# Patient Record
Sex: Male | Born: 1990 | Race: White | Hispanic: No | Marital: Single | Smoking: Former smoker
Health system: Southern US, Community
[De-identification: ages and names within clinical notes are randomized; demographics above are authoritative.]

## PROBLEM LIST (undated history)

## (undated) HISTORY — PX: APPENDECTOMY: SHX54

---
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
## 2009-07-13 ENCOUNTER — Emergency Department (HOSPITAL_COMMUNITY): Admission: EM | Admit: 2009-07-13 | Discharge: 2009-07-13 | Payer: Self-pay | Admitting: Emergency Medicine

## 2010-02-25 ENCOUNTER — Emergency Department (HOSPITAL_COMMUNITY): Admission: EM | Admit: 2010-02-25 | Discharge: 2009-05-05 | Payer: Self-pay | Admitting: Emergency Medicine

## 2010-06-09 LAB — POCT I-STAT, CHEM 8
Chloride: 106 mEq/L (ref 96–112)
HCT: 41 % (ref 39.0–52.0)
TCO2: 27 mmol/L (ref 0–100)

## 2010-06-09 LAB — URINALYSIS, ROUTINE W REFLEX MICROSCOPIC
Bilirubin Urine: NEGATIVE
Glucose, UA: NEGATIVE mg/dL
Hgb urine dipstick: NEGATIVE
Nitrite: NEGATIVE
Protein, ur: NEGATIVE mg/dL
Specific Gravity, Urine: 1.029 (ref 1.005–1.030)
pH: 6.5 (ref 5.0–8.0)

## 2010-06-09 LAB — CBC
Platelets: 244 10*3/uL (ref 150–400)
RBC: 4.45 MIL/uL (ref 4.22–5.81)
RDW: 12.8 % (ref 11.5–15.5)

## 2010-06-09 LAB — DIFFERENTIAL
Lymphocytes Relative: 35 % (ref 12–46)
Lymphs Abs: 2.7 10*3/uL (ref 0.7–4.0)
Neutro Abs: 4.4 10*3/uL (ref 1.7–7.7)

## 2010-10-28 ENCOUNTER — Emergency Department (HOSPITAL_COMMUNITY)
Admission: EM | Admit: 2010-10-28 | Discharge: 2010-10-29 | Disposition: A | Discharge status: Home / Self Care | Payer: Self-pay | Attending: Emergency Medicine | Admitting: Emergency Medicine

## 2010-10-28 DIAGNOSIS — X58XXXA Exposure to other specified factors, initial encounter: Secondary | ICD-10-CM | POA: Insufficient documentation

## 2010-10-28 DIAGNOSIS — M545 Low back pain, unspecified: Secondary | ICD-10-CM | POA: Insufficient documentation

## 2010-10-28 DIAGNOSIS — Y9355 Activity, bike riding: Secondary | ICD-10-CM | POA: Insufficient documentation

## 2011-03-11 ENCOUNTER — Encounter: Payer: Self-pay | Admitting: *Deleted

## 2011-03-11 ENCOUNTER — Emergency Department (HOSPITAL_COMMUNITY): Payer: Self-pay

## 2011-03-11 ENCOUNTER — Emergency Department (HOSPITAL_COMMUNITY)
Admission: EM | Admit: 2011-03-11 | Discharge: 2011-03-11 | Disposition: A | Discharge status: Home / Self Care | Payer: Self-pay | Attending: Emergency Medicine | Admitting: Emergency Medicine

## 2011-03-11 DIAGNOSIS — R079 Chest pain, unspecified: Secondary | ICD-10-CM | POA: Insufficient documentation

## 2011-03-11 DIAGNOSIS — R0781 Pleurodynia: Secondary | ICD-10-CM

## 2011-03-11 DIAGNOSIS — R071 Chest pain on breathing: Secondary | ICD-10-CM | POA: Insufficient documentation

## 2011-03-11 LAB — POCT I-STAT, CHEM 8
BUN: 15 mg/dL (ref 6–23)
Calcium, Ion: 1.21 mmol/L (ref 1.12–1.32)
Chloride: 103 mEq/L (ref 96–112)
Creatinine, Ser: 0.9 mg/dL (ref 0.50–1.35)
Glucose, Bld: 90 mg/dL (ref 70–99)
HCT: 43 % (ref 39.0–52.0)
Hemoglobin: 14.6 g/dL (ref 13.0–17.0)
Potassium: 3.8 mEq/L (ref 3.5–5.1)
Sodium: 140 mEq/L (ref 135–145)
TCO2: 26 mmol/L (ref 0–100)

## 2011-03-11 MED ORDER — HYDROCODONE-ACETAMINOPHEN 5-325 MG PO TABS: 2.0000 | ORAL_TABLET | ORAL | Status: AC | PRN

## 2011-03-11 MED ORDER — IBUPROFEN 800 MG PO TABS
800.0000 mg | ORAL_TABLET | Freq: Once | ORAL | Status: AC
  Administered 2011-03-11: 800 mg via ORAL
  Filled 2011-03-11: qty 1

## 2011-03-11 NOTE — ED Provider Notes (Signed)
Medical screening examination/treatment/procedure(s) were performed by non-physician practitioner and as supervising physician I was immediately available for consultation/collaboration.  Isador Castille, MD 03/11/11 2359 

## 2011-03-11 NOTE — ED Provider Notes (Signed)
History   Pt c/o pain to R lower ribs for the past several days.  He sts pain is sharp, constant, worsening with movement and taking deep breath.  He did admits to "rough housing" with a friend the night before but does not recall any specific injury and pain after the incidence.  He did admits to vomiting once today due to pain but denies pain with eating.  He denies abd pain, back pain, or dysurea.  Denies recent surgery, calf pain or swelling, or going on long trip.  Pt has tried OTC NSAIDs with minimal relief.    CSN: [id number]  Arrival date & time 03/11/11  1722   First MD Initiated Contact with Patient 03/11/11 1926      Chief Complaint  Patient presents with  . Chest Pain    RT sided rib pain.    (Consider location/radiation/quality/duration/timing/severity/associated sxs/prior treatment) Patient is a 20 y.o. male presenting with chest pain. The history is provided by the patient. No language interpreter was used.  Chest Pain The chest pain began 3 - 5 days ago. Chest pain occurs constantly. The pain is associated with breathing. At its most intense, the pain is at 5/10. The pain is currently at 3/10. The severity of the pain is moderate. The quality of the pain is described as sharp. Radiates to: R chest,  R ribs. Exacerbated by: movement, deep inspiration.     History reviewed. No pertinent past medical history.  History reviewed. No pertinent past surgical history.  History reviewed. No pertinent family history.  History  Substance Use Topics  . Smoking status: Current Everyday Smoker  . Smokeless tobacco: Not on file  . Alcohol Use: Yes      Review of Systems  Cardiovascular: Positive for chest pain.    Allergies  Amoxicillin and Penicillins  Home Medications   Current Outpatient Rx  Name Route Sig Dispense Refill  . IBUPROFEN 200 MG PO TABS Oral Take 400 mg by mouth every 6 (six) hours as needed. pain       BP 124/64  Pulse 63  Temp 98 F (36.7 C)   Resp 20  SpO2 100%  Physical Exam  Nursing note and vitals reviewed. Constitutional:       Awake, alert, nontoxic appearance  HENT:  Head: Atraumatic.  Eyes: Right eye exhibits no discharge. Left eye exhibits no discharge.  Neck: Neck supple.  Cardiovascular: Normal rate and regular rhythm.  Exam reveals no gallop and no friction rub.   No murmur heard. Pulmonary/Chest: Effort normal. No respiratory distress. He has no wheezes. He has no rales. He exhibits tenderness.    Abdominal: Soft. Normal appearance. There is no tenderness. There is no rebound.  Musculoskeletal: He exhibits no tenderness.       Baseline ROM, no obvious new focal weakness  Neurological:       Mental status and motor strength appears baseline for patient and situation  Skin: No rash noted.  Psychiatric: He has a normal mood and affect.    ED Course  Procedures (including critical care time)  Labs Reviewed - No data to display Dg Ribs Unilateral W/chest Right  03/11/2011  *RADIOLOGY REPORT*  Clinical Data: Injury, mid to lower anterior right rib pain for 3 days  RIGHT RIBS AND CHEST - 3+ VIEW  Comparison: None  Findings: Normal heart size, mediastinal contours, and pulmonary vascularity. Lungs clear. No pleural effusion or pneumothorax. Osseous mineralization normal. Right ribs normal appearance.  IMPRESSION: Normal exam.  Original Report Authenticated By: Lollie Marrow, M.D.     No diagnosis found.    MDM  Pt appears to have musculoskeletal pain.  CXR shows no evidence of rib fractures or other acute findings.  Abdomen is benign.  No murphy sign.  Pt is PERC negative.  However, he is  overly concerns.  Will obtain a D-dimer and Istats and given ibuprofen.  His VS is stable.          Fayrene Helper, [place] 03/11/11 2133

## 2011-03-11 NOTE — ED Notes (Signed)
Pt in c/o right rib pain after injury x3 days, pain worse with movement and deep inspiration

## 2011-03-11 NOTE — ED Notes (Signed)
Pt states on Tuesday he and a friend were horsing around.  C/o RT sided rib pain worsening w/deep breath, cough, having a BM or movement.  No distress or shob while being assessed.

## 2011-09-20 ENCOUNTER — Encounter (HOSPITAL_COMMUNITY): Payer: Self-pay | Admitting: *Deleted

## 2011-09-20 ENCOUNTER — Emergency Department (HOSPITAL_COMMUNITY): Payer: Self-pay

## 2011-09-20 ENCOUNTER — Emergency Department (HOSPITAL_COMMUNITY)
Admission: EM | Admit: 2011-09-20 | Discharge: 2011-09-20 | Disposition: A | Discharge status: Home / Self Care | Payer: Self-pay | Attending: Emergency Medicine | Admitting: Emergency Medicine

## 2011-09-20 DIAGNOSIS — F17200 Nicotine dependence, unspecified, uncomplicated: Secondary | ICD-10-CM | POA: Insufficient documentation

## 2011-09-20 DIAGNOSIS — J3489 Other specified disorders of nose and nasal sinuses: Secondary | ICD-10-CM | POA: Insufficient documentation

## 2011-09-20 DIAGNOSIS — J40 Bronchitis, not specified as acute or chronic: Secondary | ICD-10-CM | POA: Insufficient documentation

## 2011-09-20 DIAGNOSIS — R059 Cough, unspecified: Secondary | ICD-10-CM | POA: Insufficient documentation

## 2011-09-20 DIAGNOSIS — R05 Cough: Secondary | ICD-10-CM | POA: Insufficient documentation

## 2011-09-20 MED ORDER — GUAIFENESIN ER 1200 MG PO TB12: 1.0000 | ORAL_TABLET | Freq: Two times a day (BID) | ORAL | Status: DC

## 2011-09-20 MED ORDER — ALBUTEROL SULFATE (5 MG/ML) 0.5% IN NEBU: 5.0000 mg | INHALATION_SOLUTION | Freq: Once | RESPIRATORY_TRACT | Status: DC

## 2011-09-20 MED ORDER — PREDNISONE 50 MG PO TABS: 50.0000 mg | ORAL_TABLET | Freq: Every day | ORAL | Status: AC

## 2011-09-20 MED ORDER — ALBUTEROL SULFATE HFA 108 (90 BASE) MCG/ACT IN AERS
2.0000 | INHALATION_SPRAY | RESPIRATORY_TRACT | Status: DC | PRN
  Administered 2011-09-20: 2 via RESPIRATORY_TRACT
  Filled 2011-09-20: qty 6.7

## 2011-09-20 MED ORDER — PREDNISONE 20 MG PO TABS
60.0000 mg | ORAL_TABLET | Freq: Once | ORAL | Status: AC
  Administered 2011-09-20: 60 mg via ORAL
  Filled 2011-09-20: qty 3

## 2011-09-20 MED ORDER — PROMETHAZINE-DM 6.25-15 MG/5ML PO SYRP: 5.0000 mL | ORAL_SOLUTION | Freq: Four times a day (QID) | ORAL | Status: AC | PRN

## 2011-09-20 NOTE — ED Provider Notes (Signed)
Medical screening examination/treatment/procedure(s) were performed by non-physician practitioner and as supervising physician I was immediately available for consultation/collaboration.  Cheri Guppy, MD 09/20/11 [phone]

## 2011-09-20 NOTE — ED Provider Notes (Signed)
History     CSN: [id number]  Arrival date & time 09/20/11  1315   First MD Initiated Contact with Patient 09/20/11 1411      Chief Complaint  Patient presents with  . Cough  . Nasal Congestion    (Consider location/radiation/quality/duration/timing/severity/associated sxs/prior treatment) HPI Patient presents emergency Dept. with 2 days of cough and chest congestion.  He states he stopped smoking 3 days ago.  Patient states that he does not have any fever, nausea, vomiting, abdominal pain, chest pain, shortness of breath, or coughing up blood.  Patient states he he did not attempt any  Treatment prior to arrival for symptoms.  He states the cough is worse at night.  History reviewed. No pertinent past medical history.  Past Surgical History  Procedure Date  . Appendectomy     No family history on file.  History  Substance Use Topics  . Smoking status: Current Everyday Smoker  . Smokeless tobacco: Not on file  . Alcohol Use: Yes      Review of Systems All other systems negative except as documented in the HPI. All pertinent positives and negatives as reviewed in the HPI.  Allergies  Amoxicillin and Penicillins  Home Medications  No current outpatient prescriptions on file.  BP 110/65  Pulse 55  Temp 98.2 F (36.8 C) (Oral)  Resp 14  SpO2 97%  Physical Exam  Nursing note and vitals reviewed. Constitutional: He is oriented to person, place, and time. He appears well-developed and well-nourished. No distress.  HENT:  Head: Normocephalic.  Mouth/Throat: Oropharynx is clear and moist.  Eyes: Pupils are equal, round, and reactive to light.  Neck: Normal range of motion. Neck supple.  Cardiovascular: Normal rate and regular rhythm.   Pulmonary/Chest: Effort normal. No respiratory distress. He has wheezes in the right lower field and the left lower field. He has no rales.  Neurological: He is alert and oriented to person, place, and time.  Skin: Skin is warm and  dry. No rash noted.    ED Course  Procedures (including critical care time)  Labs Reviewed - No data to display Dg Chest 2 View  09/20/2011  *RADIOLOGY REPORT*  Clinical Data: Smoker with 3-day history of productive cough.  CHEST - 2 VIEW  Comparison: One-view chest x-ray 03/11/2011.  Findings: Cardiomediastinal silhouette unremarkable, unchanged. Moderate central peribronchial thickening, more so than on the prior examination.  Lungs otherwise clear.  No pleural effusions. Visualized bony thorax intact.  IMPRESSION: Mild to moderate changes of acute bronchitis and/or asthma without localized airspace pneumonia.  Original Report Authenticated By: Arnell Sieving, M.D.    Patient treated for bronchitis based on his physical exam and history of present illness, along with his x-ray findings.  Patient is, advised to return here for any worsening in his condition told to increase his fluid intake.  All questions were answered and patient given the plan and voices an understanding.    MDM          Carlyle Dolly, PA-C 09/20/11 1514

## 2011-09-20 NOTE — ED Notes (Signed)
Pt states he has had cough and chest congestion for 2 days. Pt states he stop smoking 3 days ago and now he is coughing up green mucus. Pt denies any other c/o

## 2012-03-04 ENCOUNTER — Emergency Department (HOSPITAL_COMMUNITY)
Admission: EM | Admit: 2012-03-04 | Discharge: 2012-03-04 | Disposition: A | Discharge status: Home / Self Care | Payer: Self-pay | Attending: Emergency Medicine | Admitting: Emergency Medicine

## 2012-03-04 ENCOUNTER — Encounter (HOSPITAL_COMMUNITY): Payer: Self-pay | Admitting: *Deleted

## 2012-03-04 DIAGNOSIS — R12 Heartburn: Secondary | ICD-10-CM | POA: Insufficient documentation

## 2012-03-04 DIAGNOSIS — K2970 Gastritis, unspecified, without bleeding: Secondary | ICD-10-CM | POA: Insufficient documentation

## 2012-03-04 DIAGNOSIS — F17200 Nicotine dependence, unspecified, uncomplicated: Secondary | ICD-10-CM | POA: Insufficient documentation

## 2012-03-04 DIAGNOSIS — K2990 Gastroduodenitis, unspecified, without bleeding: Secondary | ICD-10-CM | POA: Insufficient documentation

## 2012-03-04 LAB — CBC WITH DIFFERENTIAL/PLATELET
Basophils Absolute: 0 10*3/uL (ref 0.0–0.1)
Basophils Relative: 0 % (ref 0–1)
HCT: 43 % (ref 39.0–52.0)
Lymphocytes Relative: 5 % — ABNORMAL LOW (ref 12–46)
Lymphs Abs: 0.7 10*3/uL (ref 0.7–4.0)
Monocytes Absolute: 0.8 10*3/uL (ref 0.1–1.0)
Neutro Abs: 11.1 10*3/uL — ABNORMAL HIGH (ref 1.7–7.7)
Neutrophils Relative %: 87 % — ABNORMAL HIGH (ref 43–77)
RDW: 12.4 % (ref 11.5–15.5)

## 2012-03-04 LAB — BLOOD GAS, VENOUS
Bicarbonate: 24.6 mEq/L — ABNORMAL HIGH (ref 20.0–24.0)
O2 Saturation: 73.9 %
pH, Ven: 7.455 — ABNORMAL HIGH (ref 7.250–7.300)
pO2, Ven: 36.2 mmHg (ref 30.0–45.0)

## 2012-03-04 LAB — COMPREHENSIVE METABOLIC PANEL
ALT: 64 U/L — ABNORMAL HIGH (ref 0–53)
AST: 29 U/L (ref 0–37)
Albumin: 4.7 g/dL (ref 3.5–5.2)
BUN: 22 mg/dL (ref 6–23)
Calcium: 9.5 mg/dL (ref 8.4–10.5)
Glucose, Bld: 102 mg/dL — ABNORMAL HIGH (ref 70–99)
Potassium: 3.5 mEq/L (ref 3.5–5.1)
Total Protein: 7.4 g/dL (ref 6.0–8.3)

## 2012-03-04 LAB — GLUCOSE, CAPILLARY: Glucose-Capillary: 98 mg/dL (ref 70–99)

## 2012-03-04 MED ORDER — METOCLOPRAMIDE HCL 5 MG/ML IJ SOLN
10.0000 mg | Freq: Once | INTRAMUSCULAR | Status: AC
  Administered 2012-03-04: 10 mg via INTRAVENOUS
  Filled 2012-03-04: qty 2

## 2012-03-04 MED ORDER — DIPHENHYDRAMINE HCL 50 MG/ML IJ SOLN
25.0000 mg | Freq: Once | INTRAMUSCULAR | Status: AC
  Administered 2012-03-04: 25 mg via INTRAVENOUS
  Filled 2012-03-04: qty 1

## 2012-03-04 MED ORDER — SUCRALFATE 1 G PO TABS: 1.0000 g | ORAL_TABLET | Freq: Four times a day (QID) | ORAL | Status: DC

## 2012-03-04 MED ORDER — PROMETHAZINE HCL 25 MG/ML IJ SOLN
12.5000 mg | Freq: Once | INTRAMUSCULAR | Status: AC
  Administered 2012-03-04: 12.5 mg via INTRAVENOUS
  Filled 2012-03-04: qty 1

## 2012-03-04 MED ORDER — SODIUM CHLORIDE 0.9 % IV BOLUS (SEPSIS)
1000.0000 mL | Freq: Once | INTRAVENOUS | Status: AC
  Administered 2012-03-04: 1000 mL via INTRAVENOUS

## 2012-03-04 MED ORDER — OMEPRAZOLE MAGNESIUM 20 MG PO TBEC: DELAYED_RELEASE_TABLET | ORAL | Status: DC

## 2012-03-04 MED ORDER — ONDANSETRON HCL 4 MG/2ML IJ SOLN
4.0000 mg | Freq: Once | INTRAMUSCULAR | Status: AC
  Administered 2012-03-04: 4 mg via INTRAVENOUS
  Filled 2012-03-04: qty 2

## 2012-03-04 MED ORDER — PROMETHAZINE HCL 25 MG PO TABS: 25.0000 mg | ORAL_TABLET | Freq: Four times a day (QID) | ORAL | Status: AC | PRN

## 2012-03-04 MED ORDER — PANTOPRAZOLE SODIUM 40 MG IV SOLR
40.0000 mg | Freq: Once | INTRAVENOUS | Status: AC
  Administered 2012-03-04: 40 mg via INTRAVENOUS
  Filled 2012-03-04: qty 40

## 2012-03-04 MED ORDER — SUCRALFATE 1 G PO TABS
1.0000 g | ORAL_TABLET | Freq: Once | ORAL | Status: AC
  Administered 2012-03-04: 1 g via ORAL
  Filled 2012-03-04: qty 1

## 2012-03-04 NOTE — ED Notes (Signed)
Vomiting and nausea  Headache,  Chills for 3 hours

## 2012-03-04 NOTE — ED Notes (Signed)
Pt states that he began vomiting 3 hours ago. Has not been able to keep even water down.

## 2012-03-04 NOTE — ED Provider Notes (Signed)
History     CSN: [id number]  Arrival date & time 03/04/12  6213   First MD Initiated Contact with Patient 03/04/12 0358      Chief Complaint  Patient presents with  . Nausea and vomiting       (Consider location/radiation/quality/duration/timing/severity/associated sxs/prior treatment) HPI This is a 21 year old male who has had about a 3 month history of episodic vomiting. Prior episodes been mild. He is not aware of what triggers them. 2 days ago he had an episode of nausea and vomiting that lasted about 3 hours. He states most of these episodes occur at night. He is here now with nausea and vomiting that began about midnight. The symptoms are moderate to severe. It began as vomiting of stomach contents but proceeded to retching. He tried drinking water without relief. He has had some mild abdominal cramping with this. He denies diarrhea. He denies fever or chills. He has had a headache and lightheadedness with it. He is breathing deeply because, he states, it helps control the nausea. He denies urinary frequency or change in urination.  7:13 AM Addendum: The patient now admits to significant heartburn over the past 3 months. He doesn't correlate with the timing of the heartburn with his episodes of vomiting but they have both been present.  History reviewed. No pertinent past medical history.  Past Surgical History  Procedure Date  . Appendectomy     History reviewed. No pertinent family history.  History  Substance Use Topics  . Smoking status: Current Every Day Smoker  . Smokeless tobacco: Not on file  . Alcohol Use: No      Review of Systems  All other systems reviewed and are negative.    Allergies  Amoxicillin and Penicillins  Home Medications   Current Outpatient Rx  Name  Route  Sig  Dispense  Refill  . GUAIFENESIN ER 1200 MG PO TB12   Oral   Take 1 tablet (1,200 mg total) by mouth 2 (two) times daily.   20 each   0     BP 136/74  Pulse 87  Temp  98 F (36.7 C) (Oral)  Resp 22  Ht 5\' 10"  (1.778 m)  Wt 175 lb (79.379 kg)  BMI 25.11 kg/m2  SpO2 100%  Physical Exam General: Well-developed, well-nourished male in no acute distress; appearance consistent with age of record HENT: normocephalic, atraumatic Eyes: pupils equal round and reactive to light; extraocular muscles intact Neck: supple Heart: regular rate and rhythm Lungs: clear to auscultation bilaterally; mild tachypnea; deep breaths Abdomen: soft; nondistended; nontender; no masses or hepatosplenomegaly; bowel sounds present Extremities: No deformity; full range of motion; pulses normal Neurologic: Awake, alert and oriented; motor function intact in all extremities and symmetric; no facial droop Skin: Warm and dry     ED Course  Procedures (including critical care time)     MDM   Nursing notes and vitals signs, including pulse oximetry, reviewed.  Summary of this visit's results, reviewed by myself:  Labs:  Results for orders placed during the hospital encounter of 03/04/12 (from the past 24 hour(s))  GLUCOSE, CAPILLARY     Status: Normal   Collection Time   03/04/12  4:23 AM      Component Value Range   Glucose-Capillary 98  70 - 99 mg/dL  COMPREHENSIVE METABOLIC PANEL     Status: Abnormal   Collection Time   03/04/12  4:27 AM      Component Value Range   Sodium 139  135 - 145 mEq/L   Potassium 3.5  3.5 - 5.1 mEq/L   Chloride 99  96 - 112 mEq/L   CO2 25  19 - 32 mEq/L   Glucose, Bld 102 (*) 70 - 99 mg/dL   BUN 22  6 - 23 mg/dL   Creatinine, Ser 1.61  0.50 - 1.35 mg/dL   Calcium 9.5  8.4 - 09.6 mg/dL   Total Protein 7.4  6.0 - 8.3 g/dL   Albumin 4.7  3.5 - 5.2 g/dL   AST 29  0 - 37 U/L   ALT 64 (*) 0 - 53 U/L   Alkaline Phosphatase 54  39 - 117 U/L   Total Bilirubin 1.1  0.3 - 1.2 mg/dL   GFR calc non Af Amer >90  >90 mL/min   GFR calc Af Amer >90  >90 mL/min  CBC WITH DIFFERENTIAL     Status: Abnormal   Collection Time   03/04/12  4:27 AM       Component Value Range   WBC 12.7 (*) 4.0 - 10.5 K/uL   RBC 4.95  4.22 - 5.81 MIL/uL   Hemoglobin 15.7  13.0 - 17.0 g/dL   HCT 04.5  40.9 - 81.1 %   MCV 86.9  78.0 - 100.0 fL   MCH 31.7  26.0 - 34.0 pg   MCHC 36.5 (*) 30.0 - 36.0 g/dL   RDW 91.4  78.2 - 95.6 %   Platelets 231  150 - 400 K/uL   Neutrophils Relative 87 (*) 43 - 77 %   Neutro Abs 11.1 (*) 1.7 - 7.7 K/uL   Lymphocytes Relative 5 (*) 12 - 46 %   Lymphs Abs 0.7  0.7 - 4.0 K/uL   Monocytes Relative 7  3 - 12 %   Monocytes Absolute 0.8  0.1 - 1.0 K/uL   Eosinophils Relative 1  0 - 5 %   Eosinophils Absolute 0.1  0.0 - 0.7 K/uL   Basophils Relative 0  0 - 1 %   Basophils Absolute 0.0  0.0 - 0.1 K/uL  BLOOD GAS, VENOUS     Status: Abnormal   Collection Time   03/04/12  4:47 AM      Component Value Range   FIO2 0.21     pH, Ven 7.455 (*) 7.250 - 7.300   pCO2, Ven 35.5 (*) 45.0 - 50.0 mmHg   pO2, Ven 36.2  30.0 - 45.0 mmHg   Bicarbonate 24.6 (*) 20.0 - 24.0 mEq/L   TCO2 21.2  0 - 100 mmol/L   Acid-Base Excess 1.6  0.0 - 2.0 mmol/L   O2 Saturation 73.9     Patient temperature 98.6     Collection site VENOUS     Drawn by COLLECTED BY NURSE     Sample type VENOUS      7:14 AM Patient still complaining of significant nausea despite Reglan, Benadryl, Zofran and Phenergan IV. He has been seen drinking fluids without emesis. The patient states he is developed heartburn since being here and admitted to heartburn over the past 3 months as noted above. The symptoms and course are suspicious for gastritis or ulcer. We will treat him for these and refer him to gastroenterology. He has a male friend who will be with him through the day and can return him should his symptoms worsen.           Hanley Seamen, MD 03/04/12 [phone]

## 2013-07-29 ENCOUNTER — Emergency Department (INDEPENDENT_AMBULATORY_CARE_PROVIDER_SITE_OTHER): Payer: Self-pay

## 2013-07-29 ENCOUNTER — Encounter (HOSPITAL_COMMUNITY): Payer: Self-pay | Admitting: Emergency Medicine

## 2013-07-29 ENCOUNTER — Emergency Department (HOSPITAL_COMMUNITY)
Admission: EM | Admit: 2013-07-29 | Discharge: 2013-07-29 | Disposition: A | Discharge status: Home / Self Care | Payer: Self-pay | Attending: Emergency Medicine | Admitting: Emergency Medicine

## 2013-07-29 ENCOUNTER — Emergency Department (INDEPENDENT_AMBULATORY_CARE_PROVIDER_SITE_OTHER)
Admission: EM | Admit: 2013-07-29 | Discharge: 2013-07-29 | Disposition: A | Discharge status: Nursing Home (Includes ICF) | Payer: Self-pay | Source: Home / Self Care | Attending: Family Medicine | Admitting: Family Medicine

## 2013-07-29 DIAGNOSIS — Z9089 Acquired absence of other organs: Secondary | ICD-10-CM | POA: Insufficient documentation

## 2013-07-29 DIAGNOSIS — R109 Unspecified abdominal pain: Secondary | ICD-10-CM

## 2013-07-29 DIAGNOSIS — Z79899 Other long term (current) drug therapy: Secondary | ICD-10-CM | POA: Insufficient documentation

## 2013-07-29 DIAGNOSIS — R112 Nausea with vomiting, unspecified: Secondary | ICD-10-CM | POA: Insufficient documentation

## 2013-07-29 DIAGNOSIS — R1110 Vomiting, unspecified: Secondary | ICD-10-CM

## 2013-07-29 DIAGNOSIS — F17200 Nicotine dependence, unspecified, uncomplicated: Secondary | ICD-10-CM | POA: Insufficient documentation

## 2013-07-29 DIAGNOSIS — R5383 Other fatigue: Secondary | ICD-10-CM

## 2013-07-29 DIAGNOSIS — R630 Anorexia: Secondary | ICD-10-CM | POA: Insufficient documentation

## 2013-07-29 DIAGNOSIS — Z880 Allergy status to penicillin: Secondary | ICD-10-CM | POA: Insufficient documentation

## 2013-07-29 DIAGNOSIS — R5381 Other malaise: Secondary | ICD-10-CM | POA: Insufficient documentation

## 2013-07-29 LAB — URINALYSIS, ROUTINE W REFLEX MICROSCOPIC
Bilirubin Urine: NEGATIVE
Glucose, UA: NEGATIVE mg/dL
Hgb urine dipstick: NEGATIVE
Ketones, ur: NEGATIVE mg/dL
LEUKOCYTES UA: NEGATIVE
Nitrite: NEGATIVE
PROTEIN: 30 mg/dL — AB
Specific Gravity, Urine: 1.026 (ref 1.005–1.030)
UROBILINOGEN UA: 1 mg/dL (ref 0.0–1.0)
pH: 8.5 — ABNORMAL HIGH (ref 5.0–8.0)

## 2013-07-29 LAB — COMPREHENSIVE METABOLIC PANEL
ALBUMIN: 4.4 g/dL (ref 3.5–5.2)
ALT: 15 U/L (ref 0–53)
ALT: 12 U/L (ref 0–53)
AST: 21 U/L (ref 0–37)
AST: 16 U/L (ref 0–37)
Albumin: 4 g/dL (ref 3.5–5.2)
Alkaline Phosphatase: 44 U/L (ref 39–117)
Alkaline Phosphatase: 40 U/L (ref 39–117)
BILIRUBIN TOTAL: 1.3 mg/dL — AB (ref 0.3–1.2)
BUN: 16 mg/dL (ref 6–23)
BUN: 16 mg/dL (ref 6–23)
CALCIUM: 9.3 mg/dL (ref 8.4–10.5)
CHLORIDE: 110 meq/L (ref 96–112)
CO2: 26 meq/L (ref 19–32)
CO2: 25 meq/L (ref 19–32)
CREATININE: 0.76 mg/dL (ref 0.50–1.35)
CREATININE: 0.72 mg/dL (ref 0.50–1.35)
Calcium: 8.7 mg/dL (ref 8.4–10.5)
Chloride: 107 mEq/L (ref 96–112)
GFR calc Af Amer: >90 mL/min (ref >90)
GFR calc Af Amer: >90 mL/min (ref >90)
Glucose, Bld: 98 mg/dL (ref 70–99)
Glucose, Bld: 92 mg/dL (ref 70–99)
Potassium: 3.9 mEq/L (ref 3.7–5.3)
Potassium: 4.2 mEq/L (ref 3.7–5.3)
Sodium: 145 mEq/L (ref 137–147)
Sodium: 146 mEq/L (ref 137–147)
TOTAL PROTEIN: 7.1 g/dL (ref 6.0–8.3)
Total Bilirubin: 1.1 mg/dL (ref 0.3–1.2)
Total Protein: 6.6 g/dL (ref 6.0–8.3)

## 2013-07-29 LAB — CBC WITH DIFFERENTIAL/PLATELET
Basophils Absolute: 0 10*3/uL (ref 0.0–0.1)
Basophils Relative: 0 % (ref 0–1)
Eosinophils Absolute: 0 10*3/uL (ref 0.0–0.7)
Eosinophils Relative: 1 % (ref 0–5)
HEMATOCRIT: 38.3 % — AB (ref 39.0–52.0)
HEMOGLOBIN: 13.4 g/dL (ref 13.0–17.0)
LYMPHS PCT: 18 % (ref 12–46)
Lymphs Abs: 1.5 10*3/uL (ref 0.7–4.0)
MCH: 31.6 pg (ref 26.0–34.0)
MCHC: 35 g/dL (ref 30.0–36.0)
MCV: 90.3 fL (ref 78.0–100.0)
MONO ABS: 0.4 10*3/uL (ref 0.1–1.0)
MONOS PCT: 4 % (ref 3–12)
NEUTROS ABS: 6.5 10*3/uL (ref 1.7–7.7)
Neutrophils Relative %: 77 % (ref 43–77)
Platelets: 203 10*3/uL (ref 150–400)
RBC: 4.24 MIL/uL (ref 4.22–5.81)
RDW: 12.8 % (ref 11.5–15.5)
WBC: 8.4 10*3/uL (ref 4.0–10.5)

## 2013-07-29 LAB — URINE MICROSCOPIC-ADD ON

## 2013-07-29 LAB — CBC
HEMATOCRIT: 40.3 % (ref 39.0–52.0)
HEMOGLOBIN: 14.2 g/dL (ref 13.0–17.0)
MCH: 31.3 pg (ref 26.0–34.0)
MCHC: 35.2 g/dL (ref 30.0–36.0)
MCV: 89 fL (ref 78.0–100.0)
Platelets: 232 10*3/uL (ref 150–400)
RBC: 4.53 MIL/uL (ref 4.22–5.81)
RDW: 12.8 % (ref 11.5–15.5)
WBC: 7.9 10*3/uL (ref 4.0–10.5)

## 2013-07-29 LAB — LIPASE, BLOOD: LIPASE: 43 U/L (ref 11–59)

## 2013-07-29 MED ORDER — ONDANSETRON HCL 4 MG/2ML IJ SOLN
4.0000 mg | Freq: Once | INTRAMUSCULAR | Status: AC
  Administered 2013-07-29: 4 mg via INTRAVENOUS

## 2013-07-29 MED ORDER — SODIUM CHLORIDE 0.9 % IV BOLUS (SEPSIS)
1000.0000 mL | Freq: Once | INTRAVENOUS | Status: AC
  Administered 2013-07-29: 1000 mL via INTRAVENOUS

## 2013-07-29 MED ORDER — GI COCKTAIL ~~LOC~~
ORAL | Status: AC
  Filled 2013-07-29: qty 30

## 2013-07-29 MED ORDER — ONDANSETRON HCL 4 MG/2ML IJ SOLN
4.0000 mg | Freq: Once | INTRAMUSCULAR | Status: AC
  Administered 2013-07-29: 4 mg via INTRAVENOUS
  Filled 2013-07-29: qty 2

## 2013-07-29 MED ORDER — GI COCKTAIL ~~LOC~~
30.0000 mL | Freq: Once | ORAL | Status: AC
  Administered 2013-07-29: 30 mL via ORAL

## 2013-07-29 MED ORDER — PANTOPRAZOLE SODIUM 40 MG IV SOLR
40.0000 mg | Freq: Once | INTRAVENOUS | Status: AC
  Administered 2013-07-29: 40 mg via INTRAVENOUS
  Filled 2013-07-29: qty 40

## 2013-07-29 MED ORDER — OMEPRAZOLE 20 MG PO CPDR: 20.0000 mg | DELAYED_RELEASE_CAPSULE | Freq: Every day | ORAL | Status: DC

## 2013-07-29 MED ORDER — ONDANSETRON HCL 4 MG PO TABS: 4.0000 mg | ORAL_TABLET | Freq: Four times a day (QID) | ORAL | Status: AC

## 2013-07-29 MED ORDER — ONDANSETRON HCL 4 MG/2ML IJ SOLN
INTRAMUSCULAR | Status: AC
  Filled 2013-07-29: qty 2

## 2013-07-29 NOTE — ED Notes (Signed)
Patient remains in bathroom.

## 2013-07-29 NOTE — ED Provider Notes (Signed)
CSN: [id number]     Arrival date & time 07/29/13  1202 History   First MD Initiated Contact with Patient 07/29/13 1424     Chief Complaint  Patient presents with  . Abdominal Pain  . Emesis     (Consider location/radiation/quality/duration/timing/severity/associated sxs/prior Treatment) HPI Comments: Patient from urgent care with acute onset of nausea, vomiting and abdominal pain onset when he woke up this morning around 8 AM. Spots, hematemesis vomiting. Denies any blood in the stool. Denies any diarrhea. Denies any blood in the vomit. Denies any fever, sick contacts or recent travel. He has not been on antibiotics recently. Denies any alcohol use. He states he had this in December 2013 and was told was gastritis. He has had his appendix out previously. No urinary symptoms no testicular pain  The history is provided by medical records.    History reviewed. No pertinent past medical history. Past Surgical History  Procedure Laterality Date  . Appendectomy     No family history on file. History  Substance Use Topics  . Smoking status: Current Every Day Smoker  . Smokeless tobacco: Not on file  . Alcohol Use: No    Review of Systems  Constitutional: Positive for activity change and appetite change. Negative for fever.  HENT: Negative for congestion and rhinorrhea.   Respiratory: Negative for cough, chest tightness and shortness of breath.   Cardiovascular: Negative for chest pain.  Gastrointestinal: Positive for nausea, vomiting and abdominal pain. Negative for diarrhea and constipation.  Genitourinary: Negative for dysuria, hematuria and scrotal swelling.  Musculoskeletal: Negative for arthralgias and myalgias.  Skin: Negative for wound.  Neurological: Positive for weakness. Negative for dizziness and headaches.  A complete 10 system review of systems was obtained and all systems are negative except as noted in the HPI and PMH.      Allergies  Amoxicillin and  Penicillins  Home Medications   Prior to Admission medications   Medication Sig Start Date End Date Taking? Authorizing Provider  acetaminophen (TYLENOL) 500 MG tablet Take 1,000 mg by mouth every 6 (six) hours as needed. For pain or headache   Yes Historical Provider, MD  calcium carbonate-magnesium hydroxide (ROLAIDS) 334 MG CHEW Chew 1 tablet by mouth 2 (two) times daily as needed. For heartburn   Yes Historical Provider, MD  promethazine (PHENERGAN) 25 MG tablet Take 1 tablet (25 mg total) by mouth every 6 (six) hours as needed for nausea. 03/04/12  Yes John L Molpus, MD   BP 106/67  Pulse 41  Temp(Src) 97.4 F (36.3 C) (Oral)  Resp 18  SpO2 99% Physical Exam  Constitutional: He is oriented to person, place, and time. He appears well-developed and well-nourished. No distress.  HENT:  Head: Normocephalic and atraumatic.  Mouth/Throat: Oropharynx is clear and moist. No oropharyngeal exudate.  Eyes: Conjunctivae and EOM are normal. Pupils are equal, round, and reactive to light.  Neck: Normal range of motion. Neck supple.  Cardiovascular: Normal rate, regular rhythm and normal heart sounds.   No murmur heard. Pulmonary/Chest: Effort normal and breath sounds normal. No respiratory distress.  Abdominal: Soft. There is no tenderness. There is no rebound and no guarding.  No guarding or rebound  Genitourinary:  No testicular tenderness  Musculoskeletal: Normal range of motion. He exhibits no edema and no tenderness.  No CVA tenderness  Neurological: He is alert and oriented to person, place, and time. No cranial nerve deficit. Coordination normal.  Skin: Skin is warm.    ED Course  Procedures (including critical care time) Labs Review Labs Reviewed  CBC WITH DIFFERENTIAL - Abnormal; Notable for the following:    HCT 38.3 (*)    All other components within normal limits  COMPREHENSIVE METABOLIC PANEL - Abnormal; Notable for the following:    Total Bilirubin 1.3 (*)    All  other components within normal limits  URINALYSIS, ROUTINE W REFLEX MICROSCOPIC - Abnormal; Notable for the following:    pH 8.5 (*)    Protein, ur 30 (*)    All other components within normal limits  URINE MICROSCOPIC-ADD ON - Abnormal; Notable for the following:    Squamous Epithelial / LPF FEW (*)    Bacteria, UA FEW (*)    All other components within normal limits    Imaging Review Dg Abd Acute W/chest  07/29/2013   CLINICAL DATA:  Abdominal pain  EXAM: ACUTE ABDOMEN SERIES (ABDOMEN 2 VIEW & CHEST 1 VIEW)  COMPARISON:  None.  FINDINGS: There is no evidence of dilated bowel loops or free intraperitoneal air. No radiopaque calculi or other significant radiographic abnormality is seen. Heart size and mediastinal contours are within normal limits. Both lungs are clear. Mild leftward curvature of the lumbar spine is noted centered at L3.  IMPRESSION: Negative abdominal radiographs.  No acute cardiopulmonary disease.   Electronically Signed   By: Christiana PellantGretchen  Green M.D.   On: 07/29/2013 10:42     EKG Interpretation   Date/Time:  Monday Jul 29 2013 15:10:07 EDT Ventricular Rate:  45 PR Interval:  141 QRS Duration: 96 QT Interval:  478 QTC Calculation: 413 R Axis:   79 Text Interpretation:  Sinus bradycardia Nonspecific T abnrm, anterolateral  leads No previous ECGs available Confirmed by Sylvia Helms  MD, Jonee Lamore [phone]([id number])  on 07/29/2013 3:16:30 PM      MDM   Final diagnoses:  Nausea and vomiting   Abdominal pain with nausea and vomiting onset upon waking this morning. No sick contacts, recent travel.  CBC, CMP, lipase, acute abdominal series performed urgent care are unremarkable. Patient states he feels much better than he did at urgent care.  He'll be given additional IV fluids, antiemetics and by mouth challenge. No ketones in urine.  No Vomiting in the ED. Patient tolerating by mouth. Abdomen is soft and nontender. Sinus bradycardia noted. He said similar slow heart rates in  previous visits. No chest pain, shortness of breath, dizziness, lightheadedness. Denies any fevers or chills. He is feeling better than when he was urgent care.  Will give PPI, antiemetic, follow up with urgent care. Return precautions discussed.     Glynn OctaveStephen Braylon Grenda, MD 07/29/13 [phone]

## 2013-07-29 NOTE — ED Notes (Addendum)
Vomiting that started this am, general abdominal pain

## 2013-07-29 NOTE — ED Provider Notes (Signed)
Ian EchevariaRobert J Hulick is a 23 y.o. male who presents to Urgent Care today for vomiting and abdominal pain. Symptoms started about 3 hours ago. Patient has vomited multiple times. At this point his vomit is mostly mucus. He denies any diarrhea or blood in the vomit. He has not tried any medications at this point. He notes diffuse moderate abdominal pain worse with vomiting. He denies any fevers or chills. He has a past surgical history significant for appendectomy.   History reviewed. No pertinent past medical history. History  Substance Use Topics  . Smoking status: Current Every Day Smoker  . Smokeless tobacco: Not on file  . Alcohol Use: No   ROS as above Medications: No current facility-administered medications for this encounter.   Current Outpatient Prescriptions  Medication Sig Dispense Refill  . acetaminophen (TYLENOL) 500 MG tablet Take 1,000 mg by mouth every 6 (six) hours as needed. For pain or headache      . calcium carbonate-magnesium hydroxide (ROLAIDS) 334 MG CHEW Chew 1 tablet by mouth 2 (two) times daily as needed. For heartburn      . omeprazole (PRILOSEC OTC) 20 MG tablet Take 2 daily for the next 3 days then once daily.      . promethazine (PHENERGAN) 25 MG tablet Take 1 tablet (25 mg total) by mouth every 6 (six) hours as needed for nausea.  20 tablet  0  . sucralfate (CARAFATE) 1 G tablet Take 1 tablet (1 g total) by mouth 4 (four) times daily.  40 tablet  0    Exam:  BP 109/67  Pulse 50  Temp(Src) 98.2 F (36.8 C) (Oral)  Resp 16  SpO2 98% Gen:  uncomfortable appearing however nontoxic appearing HEENT: EOMI,  MMM Lungs: Normal work of breathing. CTABL Heart: RRR no MRG Abd: NABS, Soft. Diffusely tender without significant rebound or guarding. No CV angle tenderness to percussion Exts: Brisk capillary refill, warm and well perfused.   Patient was given a GI cocktail and had significant worsening of symptoms  Results for orders placed during the hospital  encounter of 07/29/13 (from the past 24 hour(s))  COMPREHENSIVE METABOLIC PANEL     Status: None   Collection Time    07/29/13 10:16 AM      Result Value Ref Range   Sodium 145  137 - 147 mEq/L   Potassium 3.9  3.7 - 5.3 mEq/L   Chloride 107  96 - 112 mEq/L   CO2 26  19 - 32 mEq/L   Glucose, Bld 98  70 - 99 mg/dL   BUN 16  6 - 23 mg/dL   Creatinine, Ser 1.610.76  0.50 - 1.35 mg/dL   Calcium 9.3  8.4 - 09.610.5 mg/dL   Total Protein 7.1  6.0 - 8.3 g/dL   Albumin 4.4  3.5 - 5.2 g/dL   AST 21  0 - 37 U/L   ALT 15  0 - 53 U/L   Alkaline Phosphatase 44  39 - 117 U/L   Total Bilirubin 1.1  0.3 - 1.2 mg/dL   GFR calc non Af Amer >90  >90 mL/min   GFR calc Af Amer >90  >90 mL/min  CBC     Status: None   Collection Time    07/29/13 10:16 AM      Result Value Ref Range   WBC 7.9  4.0 - 10.5 K/uL   RBC 4.53  4.22 - 5.81 MIL/uL   Hemoglobin 14.2  13.0 - 17.0 g/dL  HCT 40.3  39.0 - 52.0 %   MCV 89.0  78.0 - 100.0 fL   MCH 31.3  26.0 - 34.0 pg   MCHC 35.2  30.0 - 36.0 g/dL   RDW 16.112.8  09.611.5 - 04.515.5 %   Platelets 232  150 - 400 K/uL  LIPASE, BLOOD     Status: None   Collection Time    07/29/13 10:16 AM      Result Value Ref Range   Lipase 43  11 - 59 U/L   Dg Abd Acute W/chest  07/29/2013   CLINICAL DATA:  Abdominal pain  EXAM: ACUTE ABDOMEN SERIES (ABDOMEN 2 VIEW & CHEST 1 VIEW)  COMPARISON:  None.  FINDINGS: There is no evidence of dilated bowel loops or free intraperitoneal air. No radiopaque calculi or other significant radiographic abnormality is seen. Heart size and mediastinal contours are within normal limits. Both lungs are clear. Mild leftward curvature of the lumbar spine is noted centered at L3.  IMPRESSION: Negative abdominal radiographs.  No acute cardiopulmonary disease.   Electronically Signed   By: Christiana PellantGretchen  Green M.D.   On: 07/29/2013 10:42    Assessment and Plan: 23 y.o. male with continued vomiting and abdominal pain. Patient appears to be ill despite moving her normal  saline, GI cocktail and IV Zofran. He continues to vomit and have significant abdominal pain. Plan to transfer to the emergency room for further evaluation and management  Discussed warning signs or symptoms. Please see discharge instructions. Patient expresses understanding.    Ian BongEvan S Cobi Aldape, MD 07/29/13 [phone]

## 2013-07-29 NOTE — ED Notes (Signed)
Dr Denyse Amasscorey notified.

## 2013-07-29 NOTE — Discharge Instructions (Signed)

## 2013-07-29 NOTE — ED Notes (Signed)
EKG completed and given to EDP.  

## 2013-07-29 NOTE — ED Notes (Addendum)
Woke up feeling ok then a lot of vomiting [facility]  From [facility] and given meds for nausea there

## 2013-07-30 NOTE — Discharge Summary (Signed)
[facility] did not get to see patient but will be sending information about [facility], using the address provided.  °

## 2017-08-03 ENCOUNTER — Encounter (HOSPITAL_COMMUNITY): Payer: Self-pay | Admitting: Emergency Medicine

## 2017-08-03 ENCOUNTER — Ambulatory Visit (HOSPITAL_COMMUNITY)
Admission: EM | Admit: 2017-08-03 | Discharge: 2017-08-03 | Disposition: A | Discharge status: Home / Self Care | Payer: Self-pay | Attending: Family Medicine | Admitting: Family Medicine

## 2017-08-03 ENCOUNTER — Other Ambulatory Visit: Payer: Self-pay

## 2017-08-03 DIAGNOSIS — K0510 Chronic gingivitis, plaque induced: Secondary | ICD-10-CM

## 2017-08-03 MED ORDER — CHLORHEXIDINE GLUCONATE 0.12 % MT SOLN: 15.0000 mL | Freq: Two times a day (BID) | OROMUCOSAL | 0 refills | Status: AC

## 2017-08-03 NOTE — ED Provider Notes (Signed)
[facility]   [id number] 08/03/17 Arrival Time: 1537   SUBJECTIVE:  Ian Barber is a 27 y.o. male who presents to the urgent care with complaint of "white string" under his tongue, his rectum has been slightly itchy, and he states his stomach has felt "a little" weird.  He is afraid he might have worms.  No foreign travel  Works as a Production assistant, radio  History reviewed. No pertinent past medical history. Family History  Problem Relation Age of Onset  . Heart disease Mother   . Schizophrenia Father   . Schizophrenia Sister   . Tuberculosis Brother    Social History   Socioeconomic History  . Marital status: Single    Spouse name: Not on file  . Number of children: Not on file  . Years of education: Not on file  . Highest education level: Not on file  Occupational History  . Not on file  Social Needs  . Financial resource strain: Not on file  . Food insecurity:    Worry: Not on file    Inability: Not on file  . Transportation needs:    Medical: Not on file    Non-medical: Not on file  Tobacco Use  . Smoking status: Current Every Day Smoker  . Smokeless tobacco: Never Used  Substance and Sexual Activity  . Alcohol use: No  . Drug use: Yes    Types: Marijuana  . Sexual activity: Not on file  Lifestyle  . Physical activity:    Days per week: Not on file    Minutes per session: Not on file  . Stress: Not on file  Relationships  . Social connections:    Talks on phone: Not on file    Gets together: Not on file    Attends religious service: Not on file    Active member of club or organization: Not on file    Attends meetings of clubs or organizations: Not on file    Relationship status: Not on file  . Intimate partner violence:    Fear of current or ex partner: Not on file    Emotionally abused: Not on file    Physically abused: Not on file    Forced sexual activity: Not on file  Other Topics Concern  . Not on file  Social History Narrative  . Not on  file   No outpatient medications have been marked as taking for the 08/03/17 encounter [facility] Encounter).   Allergies  Allergen Reactions  . Amoxicillin Hives  . Penicillins Hives      ROS: As per HPI, remainder of ROS negative.   OBJECTIVE:   Vitals:   08/03/17 1613  BP: 117/79  Pulse: 87  Temp: 98.4 F (36.9 C)  TempSrc: Oral  SpO2: 97%     General appearance: alert; no distress Eyes: PERRL; EOMI; conjunctiva normal HENT: normocephalic; atraumatic; ; oral mucosa mild gingivitis Neck: supple Abdomen: soft, non-tender; bowel sounds normal; no masses or organomegaly; no guarding or rebound tenderness; anus normal Back: no CVA tenderness Extremities: no cyanosis or edema; symmetrical with no gross deformities Skin: warm and dry Neurologic: normal gait; grossly normal Psychological: alert and cooperative; normal mood and affect      Labs:  Results for orders placed or performed during the hospital encounter of 07/29/13  CBC with Differential  Result Value Ref Range   WBC 8.4 4.0 - 10.5 K/uL   RBC 4.24 4.22 - 5.81 MIL/uL   Hemoglobin 13.4 13.0 - 17.0 g/dL  HCT 38.3 (L) 39.0 - 52.0 %   MCV 90.3 78.0 - 100.0 fL   MCH 31.6 26.0 - 34.0 pg   MCHC 35.0 30.0 - 36.0 g/dL   RDW 16.1 09.6 - 04.5 %   Platelets 203 150 - 400 K/uL   Neutrophils Relative % 77 43 - 77 %   Neutro Abs 6.5 1.7 - 7.7 K/uL   Lymphocytes Relative 18 12 - 46 %   Lymphs Abs 1.5 0.7 - 4.0 K/uL   Monocytes Relative 4 3 - 12 %   Monocytes Absolute 0.4 0.1 - 1.0 K/uL   Eosinophils Relative 1 0 - 5 %   Eosinophils Absolute 0.0 0.0 - 0.7 K/uL   Basophils Relative 0 0 - 1 %   Basophils Absolute 0.0 0.0 - 0.1 K/uL  Comprehensive metabolic panel  Result Value Ref Range   Sodium 146 137 - 147 mEq/L   Potassium 4.2 3.7 - 5.3 mEq/L   Chloride 110 96 - 112 mEq/L   CO2 25 19 - 32 mEq/L   Glucose, Bld 92 70 - 99 mg/dL   BUN 16 6 - 23 mg/dL   Creatinine, Ser 4.09 0.50 - 1.35 mg/dL   Calcium 8.7  8.4 - 81.1 mg/dL   Total Protein 6.6 6.0 - 8.3 g/dL   Albumin 4.0 3.5 - 5.2 g/dL   AST 16 0 - 37 U/L   ALT 12 0 - 53 U/L   Alkaline Phosphatase 40 39 - 117 U/L   Total Bilirubin 1.3 (H) 0.3 - 1.2 mg/dL   GFR calc non Af Amer >90 >90 mL/min   GFR calc Af Amer >90 >90 mL/min  Urinalysis, Routine w reflex microscopic  Result Value Ref Range   Color, Urine YELLOW YELLOW   APPearance CLEAR CLEAR   Specific Gravity, Urine 1.026 1.005 - 1.030   pH 8.5 (H) 5.0 - 8.0   Glucose, UA NEGATIVE NEGATIVE mg/dL   Hgb urine dipstick NEGATIVE NEGATIVE   Bilirubin Urine NEGATIVE NEGATIVE   Ketones, ur NEGATIVE NEGATIVE mg/dL   Protein, ur 30 (A) NEGATIVE mg/dL   Urobilinogen, UA 1.0 0.0 - 1.0 mg/dL   Nitrite NEGATIVE NEGATIVE   Leukocytes, UA NEGATIVE NEGATIVE  Urine microscopic-add on  Result Value Ref Range   Squamous Epithelial / LPF FEW (A) RARE   WBC, UA 0-2 <3 WBC/hpf   Bacteria, UA FEW (A) RARE   Urine-Other MUCOUS PRESENT     Labs Reviewed - No data to display  No results found.     ASSESSMENT & PLAN:  1. Gingivitis     Meds ordered this encounter  Medications  . chlorhexidine (PERIDEX) 0.12 % solution    Sig: Use as directed 15 mLs in the mouth or throat 2 (two) times daily.    Dispense:  120 mL    Refill:  0    Reviewed expectations re: course of current medical issues. Questions answered. Outlined signs and symptoms indicating need for more acute intervention. Patient verbalized understanding. After Visit Summary given.    Procedures:      Elvina Sidle, MD 08/03/17 [phone]

## 2017-08-03 NOTE — ED Triage Notes (Signed)
Pt states he saw a "white string" under his tongue, his rectum has been slightly itchy, and he states his stomach has felt "a little" weird.  He is afraid he might have worms.

## 2017-09-23 ENCOUNTER — Emergency Department (HOSPITAL_COMMUNITY)
Admission: EM | Admit: 2017-09-23 | Discharge: 2017-09-23 | Disposition: A | Discharge status: Home / Self Care | Payer: Self-pay | Attending: Emergency Medicine | Admitting: Emergency Medicine

## 2017-09-23 ENCOUNTER — Encounter (HOSPITAL_COMMUNITY): Payer: Self-pay | Admitting: *Deleted

## 2017-09-23 ENCOUNTER — Emergency Department (HOSPITAL_COMMUNITY): Payer: Self-pay

## 2017-09-23 ENCOUNTER — Other Ambulatory Visit: Payer: Self-pay

## 2017-09-23 DIAGNOSIS — Z79899 Other long term (current) drug therapy: Secondary | ICD-10-CM | POA: Insufficient documentation

## 2017-09-23 DIAGNOSIS — F17210 Nicotine dependence, cigarettes, uncomplicated: Secondary | ICD-10-CM | POA: Insufficient documentation

## 2017-09-23 DIAGNOSIS — K5792 Diverticulitis of intestine, part unspecified, without perforation or abscess without bleeding: Secondary | ICD-10-CM

## 2017-09-23 DIAGNOSIS — K5732 Diverticulitis of large intestine without perforation or abscess without bleeding: Secondary | ICD-10-CM | POA: Insufficient documentation

## 2017-09-23 LAB — CBC
HCT: 40.9 % (ref 39.0–52.0)
Hemoglobin: 14.4 g/dL (ref 13.0–17.0)
MCH: 31.7 pg (ref 26.0–34.0)
MCHC: 35.2 g/dL (ref 30.0–36.0)
MCV: 90.1 fL (ref 78.0–100.0)
PLATELETS: 235 10*3/uL (ref 150–400)
RBC: 4.54 MIL/uL (ref 4.22–5.81)
RDW: 12.8 % (ref 11.5–15.5)
WBC: 9.8 10*3/uL (ref 4.0–10.5)

## 2017-09-23 LAB — COMPREHENSIVE METABOLIC PANEL
ALK PHOS: 44 U/L (ref 38–126)
ALT: 15 U/L (ref 0–44)
AST: 16 U/L (ref 15–41)
Albumin: 4.5 g/dL (ref 3.5–5.0)
Anion gap: 10 (ref 5–15)
BUN: 13 mg/dL (ref 6–20)
CALCIUM: 9 mg/dL (ref 8.9–10.3)
CHLORIDE: 107 mmol/L (ref 98–111)
CO2: 22 mmol/L (ref 22–32)
CREATININE: 0.87 mg/dL (ref 0.61–1.24)
Glucose, Bld: 83 mg/dL (ref 70–99)
Potassium: 3.7 mmol/L (ref 3.5–5.1)
SODIUM: 139 mmol/L (ref 135–145)
Total Bilirubin: 1.5 mg/dL — ABNORMAL HIGH (ref 0.3–1.2)
Total Protein: 7.6 g/dL (ref 6.5–8.1)

## 2017-09-23 LAB — LIPASE, BLOOD: Lipase: 23 U/L (ref 11–51)

## 2017-09-23 LAB — URINALYSIS, ROUTINE W REFLEX MICROSCOPIC
BILIRUBIN URINE: NEGATIVE
GLUCOSE, UA: NEGATIVE mg/dL
HGB URINE DIPSTICK: NEGATIVE
KETONES UR: 20 mg/dL — AB
Leukocytes, UA: NEGATIVE
Nitrite: NEGATIVE
PROTEIN: NEGATIVE mg/dL
Specific Gravity, Urine: 1.023 (ref 1.005–1.030)
pH: 5 (ref 5.0–8.0)

## 2017-09-23 MED ORDER — ONDANSETRON HCL 4 MG/2ML IJ SOLN
4.0000 mg | Freq: Once | INTRAMUSCULAR | Status: AC
  Administered 2017-09-23: 4 mg via INTRAVENOUS
  Filled 2017-09-23: qty 2

## 2017-09-23 MED ORDER — MORPHINE SULFATE (PF) 4 MG/ML IV SOLN
4.0000 mg | Freq: Once | INTRAVENOUS | Status: AC
  Administered 2017-09-23: 4 mg via INTRAVENOUS
  Filled 2017-09-23: qty 1

## 2017-09-23 MED ORDER — HYDROCODONE-ACETAMINOPHEN 5-325 MG PO TABS: 1.0000 | ORAL_TABLET | Freq: Four times a day (QID) | ORAL | 0 refills | Status: AC | PRN

## 2017-09-23 MED ORDER — SODIUM CHLORIDE 0.9 % IV BOLUS
1000.0000 mL | Freq: Once | INTRAVENOUS | Status: AC
  Administered 2017-09-23: 1000 mL via INTRAVENOUS

## 2017-09-23 MED ORDER — IOPAMIDOL (ISOVUE-300) INJECTION 61%
100.0000 mL | Freq: Once | INTRAVENOUS | Status: AC | PRN
  Administered 2017-09-23: 100 mL via INTRAVENOUS

## 2017-09-23 MED ORDER — METRONIDAZOLE 500 MG PO TABS
500.0000 mg | ORAL_TABLET | Freq: Once | ORAL | Status: AC
  Administered 2017-09-23: 500 mg via ORAL
  Filled 2017-09-23: qty 1

## 2017-09-23 MED ORDER — ONDANSETRON 4 MG PO TBDP: ORAL_TABLET | ORAL | 0 refills | Status: AC

## 2017-09-23 MED ORDER — IOPAMIDOL (ISOVUE-300) INJECTION 61%
INTRAVENOUS | Status: AC
  Administered 2017-09-23: 23:00:00
  Filled 2017-09-23: qty 100

## 2017-09-23 MED ORDER — CIPROFLOXACIN HCL 500 MG PO TABS
500.0000 mg | ORAL_TABLET | Freq: Once | ORAL | Status: AC
  Administered 2017-09-23: 500 mg via ORAL
  Filled 2017-09-23: qty 1

## 2017-09-23 MED ORDER — METRONIDAZOLE 500 MG PO TABS: 500.0000 mg | ORAL_TABLET | Freq: Three times a day (TID) | ORAL | 0 refills | Status: AC

## 2017-09-23 MED ORDER — CIPROFLOXACIN HCL 500 MG PO TABS: 500.0000 mg | ORAL_TABLET | Freq: Two times a day (BID) | ORAL | 0 refills | Status: AC

## 2017-09-23 NOTE — ED Triage Notes (Signed)
Pt stated "have been having LLQ pain off and on x a month or two."  Denies n/v/d.

## 2017-09-23 NOTE — Discharge Instructions (Signed)
Your CT scan shows diverticulitis, please take antibiotics as directed, please make sure you take the entire course even if your symptoms completely resolved.  You may use Zofran as needed for nausea, and Norco for breakthrough pain, you may also use ibuprofen for your pain.  Please use MiraLAX daily and Dulcolax as needed to ensure your stools remain soft.  You will need to follow-up with GI after you complete your antibiotics.  Return to the emergency department for any worsening pain, fevers, persistent vomiting or any other new or concerning symptoms.

## 2017-09-23 NOTE — ED Provider Notes (Signed)
[facility]-EMERGENCY DEPT Provider Note   CSN: [id number] Arrival date & time: 09/23/17  1957     History   Chief Complaint Chief Complaint  Patient presents with  . Abdominal Pain    HPI Ian EchevariaRobert J Barber is a 27 y.o. male.  Ian Barber is a 27 y.o. Male with a history of appendectomy, otherwise healthy, who presents to the emergency department for evaluation of left lower quadrant abdominal pain.  Patient reports for the past month or so he has had some intermittent left lower quadrant abdominal pain that has not bothered him much, but over the past 2 days he has had significantly worsened constant pain.  Pain is worse with palpation.  Patient reports associated decreased appetite, no nausea or vomiting.  He reports he has been constipated and has had very hard stools and difficulty passing them, denies any melena or hematochezia, still able to pass gas.  He denies fevers.  No dysuria or urinary frequency.  Has not taken anything prior to arrival to treat his pain.  Aside from appendectomy no history of other abdominal surgeries.  No chest pain or shortness of breath.     History reviewed. No pertinent past medical history.  There are no active problems to display for this patient.   Past Surgical History:  Procedure Laterality Date  . APPENDECTOMY          Home Medications    Prior to Admission medications   Medication Sig Start Date End Date Taking? Authorizing Provider  bismuth subsalicylate (PEPTO BISMOL) 262 MG/15ML suspension Take 30 mLs by mouth every 6 (six) hours as needed for indigestion.   Yes [provider]  ibuprofen (ADVIL,MOTRIN) 200 MG tablet Take 200 mg by mouth every 6 (six) hours as needed for moderate pain.   Yes [provider]  acetaminophen (TYLENOL) 500 MG tablet Take 1,000 mg by mouth every 6 (six) hours as needed. For pain or headache    [provider]  chlorhexidine (PERIDEX) 0.12 % solution  Use as directed 15 mLs in the mouth or throat 2 (two) times daily. Patient not taking: Reported on 09/23/2017 08/03/17   Elvina SidleLauenstein, Kurt, MD  ciprofloxacin (CIPRO) 500 MG tablet Take 1 tablet (500 mg total) by mouth 2 (two) times daily. One po bid x 7 days 09/23/17   Dartha LodgeFord, Bowen Goyal N, PA-C  HYDROcodone-acetaminophen [facility](NORCO) 5-325 MG tablet Take 1 tablet by mouth every 6 (six) hours as needed. 09/23/17   Dartha LodgeFord, Kaius Daino N, PA-C  metroNIDAZOLE (FLAGYL) 500 MG tablet Take 1 tablet (500 mg total) by mouth 3 (three) times daily for 7 days. One po bid x 7 days 09/23/17 09/30/17  Dartha LodgeFord, Lynda Wanninger N, PA-C  ondansetron (ZOFRAN ODT) 4 MG disintegrating tablet 4mg  ODT q4 hours prn nausea/vomit 09/23/17   Dartha LodgeFord, Sione Baumgarten N, PA-C  ondansetron (ZOFRAN) 4 MG tablet Take 1 tablet (4 mg total) by mouth every 6 (six) hours. Patient not taking: Reported on 09/23/2017 07/29/13   Glynn Octaveancour, Stephen, MD  promethazine (PHENERGAN) 25 MG tablet Take 1 tablet (25 mg total) by mouth every 6 (six) hours as needed for nausea. Patient not taking: Reported on 09/23/2017 03/04/12   Molpus, Jonny RuizJohn, MD    Family History Family History  Problem Relation Age of Onset  . Heart disease Mother   . Schizophrenia Father   . Schizophrenia Sister   . Tuberculosis Brother     Social History Social History   Tobacco Use  . Smoking status: Current  Every Day Smoker  . Smokeless tobacco: Never Used  Substance Use Topics  . Alcohol use: Yes    Comment: rarely  . Drug use: Yes    Types: Marijuana     Allergies   Amoxicillin and Penicillins   Review of Systems Review of Systems  Constitutional: Positive for appetite change. Negative for chills and fever.  HENT: Negative.   Eyes: Negative for visual disturbance.  Respiratory: Negative for cough and shortness of breath.   Cardiovascular: Negative for chest pain.  Gastrointestinal: Positive for abdominal pain and constipation. Negative for blood in stool, nausea and vomiting.  Genitourinary: Negative for  dysuria, flank pain and frequency.  Musculoskeletal: Negative for arthralgias, back pain and myalgias.  Skin: Negative for color change and rash.  Neurological: Negative for dizziness, syncope and light-headedness.     Physical Exam Updated Vital Signs BP 123/80 (BP Location: Right Arm)   Pulse 81   Temp 98.5 F (36.9 C) (Oral)   Resp 18   Ht 5\' 11"  (1.803 m)   Wt 84.6 kg (186 lb 9.6 oz)   SpO2 99%   BMI 26.03 kg/m   Physical Exam  Constitutional: He is oriented to person, place, and time. He appears well-developed and well-nourished. No distress.  HENT:  Head: Normocephalic and atraumatic.  Mouth/Throat: Oropharynx is clear and moist.  Eyes: Right eye exhibits no discharge. Left eye exhibits no discharge.  Neck: Neck supple.  Cardiovascular: Normal rate, regular rhythm, normal heart sounds and intact distal pulses.  Pulmonary/Chest: Effort normal and breath sounds normal. No respiratory distress.  Respirations equal and unlabored, patient able to speak in full sentences, lungs clear to auscultation bilaterally  Abdominal: Soft. Normal appearance and bowel sounds are normal. He exhibits no distension. There is tenderness in the left lower quadrant. There is guarding. There is no rigidity, no rebound and no CVA tenderness.  Abdomen soft, nondistended, bowel sounds present throughout, there is focal tenderness in the left lower quadrant with guarding, no CVA tenderness  Musculoskeletal: He exhibits no edema or deformity.  Neurological: He is alert and oriented to person, place, and time. Coordination normal.  Skin: Skin is warm and dry. Capillary refill takes less than 2 seconds. He is not diaphoretic.  Psychiatric: He has a normal mood and affect. His behavior is normal.  Nursing note and vitals reviewed.    ED Treatments / Results  Labs (all labs ordered are listed, but only abnormal results are displayed) Labs Reviewed  COMPREHENSIVE METABOLIC PANEL - Abnormal; Notable  for the following components:      Result Value   Total Bilirubin 1.5 (*)    All other components within normal limits  URINALYSIS, ROUTINE W REFLEX MICROSCOPIC - Abnormal; Notable for the following components:   Ketones, ur 20 (*)    All other components within normal limits  LIPASE, BLOOD  CBC    EKG None  Radiology Ct Abdomen Pelvis W Contrast  Result Date: 09/23/2017 CLINICAL DATA:  Left lower quadrant pain off and on for a month or 2. EXAM: CT ABDOMEN AND PELVIS WITH CONTRAST TECHNIQUE: Multidetector CT imaging of the abdomen and pelvis was performed using the standard protocol following bolus administration of intravenous contrast. CONTRAST:  ISOVUE-300 IOPAMIDOL (ISOVUE-300) INJECTION 61% COMPARISON:  None. FINDINGS: Lower chest: Lung bases are clear. Hepatobiliary: No focal liver abnormality is seen. No gallstones, gallbladder wall thickening, or biliary dilatation. Pancreas: Unremarkable. No pancreatic ductal dilatation or surrounding inflammatory changes. Spleen: Normal in size without focal  abnormality. Adrenals/Urinary Tract: Adrenal glands are unremarkable. Kidneys are normal, without renal calculi, focal lesion, or hydronephrosis. Bladder is unremarkable. Stomach/Bowel: Stomach, small bowel, and colon are not abnormally distended. Scattered stool in the colon. No wall thickening or inflammatory changes are identified. There a few scattered diverticula in the colon. Infiltration in the pericolonic fat of the upper descending colon just below the splenic flexure. This could represent a focal area of acute diverticulitis or focal area of fat necrosis. No abscess is identified. Appendix is not identified. Vascular/Lymphatic: No significant vascular findings are present. No enlarged abdominal or pelvic lymph nodes. Reproductive: Prostate gland is enlarged, measuring 4.7 cm diameter. Other: No free air or free fluid in the abdomen. Abdominal wall musculature appears intact.  Musculoskeletal: No acute or significant osseous findings. IMPRESSION: 1. Scattered diverticula in the colon. 2. Infiltration in the pericolonic fat around the upper descending colon just below the splenic fracture suggesting a focal area of acute diverticulitis or focal fat necrosis. No abscess. 3. Prostate gland is enlarged. Electronically Signed   By: Burman Nieves M.D.   On: 09/23/2017 22:33    Procedures Procedures (including critical care time)  Medications Ordered in ED Medications  ciprofloxacin (CIPRO) tablet 500 mg (has no administration in time range)  metroNIDAZOLE (FLAGYL) tablet 500 mg (has no administration in time range)  ondansetron (ZOFRAN) injection 4 mg (4 mg Intravenous Given 09/23/17 2204)  morphine 4 MG/ML injection 4 mg (4 mg Intravenous Given 09/23/17 2204)  sodium chloride 0.9 % bolus 1,000 mL (0 mLs Intravenous Stopped 09/23/17 2242)  iopamidol (ISOVUE-300) 61 % injection 100 mL (100 mLs Intravenous Contrast Given 09/23/17 2216)  iopamidol (ISOVUE-300) 61 % injection (  Contrast Given 09/23/17 2242)     Initial Impression / Assessment and Plan / ED Course  I have reviewed the triage vital signs and the nursing notes.  Pertinent labs & imaging results that were available during my care of the patient were reviewed by me and considered in my medical decision making (see chart for details).  Patient presents for left lower quadrant pain initially intermittent now persistent and worsening, no fevers, nausea, vomiting.  Patient does report associated constipation, no melena or hematochezia.  Vitals normal and patient is in no acute distress.  On exam patient is focally tender in the left lower quadrant with guarding.  Presentation is story concerning for diverticulitis will get abdominal labs and CT abdomen pelvis.  IV fluid bolus, Zofran and morphine for symptomatic management.  Labs reassuring, no leukocytosis, normal hemoglobin, no acute electrolyte derangements, normal  renal and liver function, normal lipase, urinalysis with 20 ketones, but no evidence of infection.  CT shows scattered diverticula with evidence of diverticulitis in the upper descending colon no evidence of abscess or perforation, prostate gland is somewhat enlarged.  Discussed results with patient.  He is tolerating p.o. here in the ED, provided first dose of antibiotics.  At this time patient is stable for discharge home, prescriptions for Cipro and Flagyl provided as well as Zofran, and small amount of Norco for pain management.  Counseled on bowel regimen.  Patient to advance diet slowly.  Return precautions discussed.  Patient to follow-up with GI.  Patient expressed understanding and is in agreement with plan.  Final Clinical Impressions(s) / ED Diagnoses   Final diagnoses:  Diverticulitis    ED Discharge Orders        Ordered    ciprofloxacin (CIPRO) 500 MG tablet  2 times daily  09/23/17 2246    metroNIDAZOLE (FLAGYL) 500 MG tablet  3 times daily     09/23/17 2246    ondansetron (ZOFRAN ODT) 4 MG disintegrating tablet     09/23/17 2246    HYDROcodone-acetaminophen (NORCO) 5-325 MG tablet  Every 6 hours PRN     09/23/17 2246       Dartha Lodge, PA-C 09/23/17 2302    Arby Barrette, MD 09/26/17 1229

## 2017-10-26 ENCOUNTER — Emergency Department (HOSPITAL_COMMUNITY)
Admission: EM | Admit: 2017-10-26 | Discharge: 2017-10-27 | Disposition: A | Discharge status: Home / Self Care | Payer: Self-pay | Attending: Emergency Medicine | Admitting: Emergency Medicine

## 2017-10-26 ENCOUNTER — Encounter (HOSPITAL_COMMUNITY): Payer: Self-pay | Admitting: Emergency Medicine

## 2017-10-26 DIAGNOSIS — F1290 Cannabis use, unspecified, uncomplicated: Secondary | ICD-10-CM | POA: Insufficient documentation

## 2017-10-26 DIAGNOSIS — F17210 Nicotine dependence, cigarettes, uncomplicated: Secondary | ICD-10-CM | POA: Insufficient documentation

## 2017-10-26 DIAGNOSIS — K5900 Constipation, unspecified: Secondary | ICD-10-CM | POA: Insufficient documentation

## 2017-10-26 DIAGNOSIS — R202 Paresthesia of skin: Secondary | ICD-10-CM | POA: Insufficient documentation

## 2017-10-26 NOTE — ED Triage Notes (Signed)
Pt from home with c/o left sided abdominal numbness. Pt states this began earlier this evening. Pt states he had constipation x 2 days. No neuro deficits.

## 2017-10-27 ENCOUNTER — Emergency Department (HOSPITAL_COMMUNITY): Payer: Self-pay

## 2017-10-27 LAB — CBC WITH DIFFERENTIAL/PLATELET
Basophils Absolute: 0.1 10*3/uL (ref 0.0–0.1)
Basophils Relative: 1 %
Eosinophils Absolute: 0.3 10*3/uL (ref 0.0–0.7)
Eosinophils Relative: 4 %
HEMATOCRIT: 39.1 % (ref 39.0–52.0)
HEMOGLOBIN: 13.8 g/dL (ref 13.0–17.0)
LYMPHS PCT: 43 %
Lymphs Abs: 3 10*3/uL (ref 0.7–4.0)
MCH: 31.9 pg (ref 26.0–34.0)
MCHC: 35.3 g/dL (ref 30.0–36.0)
MCV: 90.5 fL (ref 78.0–100.0)
MONO ABS: 0.4 10*3/uL (ref 0.1–1.0)
MONOS PCT: 5 %
NEUTROS ABS: 3.3 10*3/uL (ref 1.7–7.7)
NEUTROS PCT: 47 %
Platelets: 251 10*3/uL (ref 150–400)
RBC: 4.32 MIL/uL (ref 4.22–5.81)
RDW: 13 % (ref 11.5–15.5)
WBC: 7 10*3/uL (ref 4.0–10.5)

## 2017-10-27 LAB — URINALYSIS, ROUTINE W REFLEX MICROSCOPIC
Bilirubin Urine: NEGATIVE
Glucose, UA: NEGATIVE mg/dL
Hgb urine dipstick: NEGATIVE
Ketones, ur: NEGATIVE mg/dL
Leukocytes, UA: NEGATIVE
NITRITE: NEGATIVE
PROTEIN: 30 mg/dL — AB
SPECIFIC GRAVITY, URINE: 1.034 — AB (ref 1.005–1.030)
pH: 5 (ref 5.0–8.0)

## 2017-10-27 LAB — COMPREHENSIVE METABOLIC PANEL
ALBUMIN: 4.2 g/dL (ref 3.5–5.0)
ALK PHOS: 39 U/L (ref 38–126)
ALT: 19 U/L (ref 0–44)
ANION GAP: 9 (ref 5–15)
AST: 17 U/L (ref 15–41)
BILIRUBIN TOTAL: 0.9 mg/dL (ref 0.3–1.2)
BUN: 17 mg/dL (ref 6–20)
CALCIUM: 8.8 mg/dL — AB (ref 8.9–10.3)
CO2: 26 mmol/L (ref 22–32)
Chloride: 108 mmol/L (ref 98–111)
Creatinine, Ser: 0.86 mg/dL (ref 0.61–1.24)
GFR calc Af Amer: >60 mL/min (ref >60)
GLUCOSE: 105 mg/dL — AB (ref 70–99)
POTASSIUM: 4 mmol/L (ref 3.5–5.1)
Sodium: 143 mmol/L (ref 135–145)
TOTAL PROTEIN: 6.4 g/dL — AB (ref 6.5–8.1)

## 2017-10-27 LAB — TROPONIN I: Troponin I: <0.03 ng/mL (ref <0.03)

## 2017-10-27 LAB — LIPASE, BLOOD: LIPASE: 29 U/L (ref 11–51)

## 2017-10-27 LAB — D-DIMER, QUANTITATIVE (NOT AT ARMC)

## 2017-10-27 MED ORDER — POLYETHYLENE GLYCOL 3350 17 G PO PACK: 17.0000 g | PACK | Freq: Every day | ORAL | 0 refills | Status: AC

## 2017-10-27 NOTE — ED Provider Notes (Signed)
[facility]-EMERGENCY DEPT Provider Note   CSN: [id number] Arrival date & time: 10/26/17  2116     History   Chief Complaint Chief Complaint  Patient presents with  . Numbness  . Constipation    HPI Ian Barber is a 27 y.o. male.  Patient with history of diverticulitis presenting with "cool sensation" to his left chest and left abdomen that onset while he was lying in bed earlier this evening.  Symptoms lasted about 30 minutes and is since resolved.  Denies any weakness in his arms or legs.  Denies any difficulty speaking or difficulty swallowing.  He was diagnosed with diverticulitis a month ago and completed antibiotics.  He denies any abdominal pain currently.  Believes he is a little constipated and has not had a bowel movement for 2 days.  Denies any nausea, vomiting, fever.  No pain with urination or blood in the urine.  He feels back to baseline now.  No headache.  Previous history of appendectomy.  The history is provided by the patient.  Constipation   Pertinent negatives include no abdominal pain and no dysuria.    History reviewed. No pertinent past medical history.  There are no active problems to display for this patient.   Past Surgical History:  Procedure Laterality Date  . APPENDECTOMY          Home Medications    Prior to Admission medications   Medication Sig Start Date End Date Taking? Authorizing Provider  chlorhexidine (PERIDEX) 0.12 % solution Use as directed 15 mLs in the mouth or throat 2 (two) times daily. Patient not taking: Reported on 09/23/2017 08/03/17   Elvina Sidle, MD  ciprofloxacin (CIPRO) 500 MG tablet Take 1 tablet (500 mg total) by mouth 2 (two) times daily. One po bid x 7 days Patient not taking: Reported on 10/26/2017 09/23/17   Dartha Lodge, PA-C  HYDROcodone-acetaminophen [facility]) 5-325 MG tablet Take 1 tablet by mouth every 6 (six) hours as needed. Patient not taking: Reported on 10/26/2017 09/23/17   Dartha Lodge, PA-C  ondansetron (ZOFRAN ODT) 4 MG disintegrating tablet 4mg  ODT q4 hours prn nausea/vomit Patient not taking: Reported on 10/27/2017 09/23/17   Dartha Lodge, PA-C  ondansetron (ZOFRAN) 4 MG tablet Take 1 tablet (4 mg total) by mouth every 6 (six) hours. Patient not taking: Reported on 09/23/2017 07/29/13   Glynn Octave, MD  promethazine (PHENERGAN) 25 MG tablet Take 1 tablet (25 mg total) by mouth every 6 (six) hours as needed for nausea. Patient not taking: Reported on 09/23/2017 03/04/12   Molpus, Jonny Ruiz, MD    Family History Family History  Problem Relation Age of Onset  . Heart disease Mother   . Schizophrenia Father   . Schizophrenia Sister   . Tuberculosis Brother     Social History Social History   Tobacco Use  . Smoking status: Current Every Day Smoker  . Smokeless tobacco: Never Used  Substance Use Topics  . Alcohol use: Yes    Comment: rarely  . Drug use: Yes    Types: Marijuana     Allergies   Amoxicillin and Penicillins   Review of Systems Review of Systems  Constitutional: Negative for activity change and fever.  HENT: Negative for congestion and rhinorrhea.   Respiratory: Negative for cough, chest tightness and shortness of breath.   Cardiovascular: Negative for chest pain.  Gastrointestinal: Positive for constipation. Negative for abdominal pain.  Genitourinary: Negative for dysuria, hematuria, testicular pain and  urgency.  Musculoskeletal: Negative for arthralgias and myalgias.  Skin: Negative for rash.  Neurological: Positive for numbness.    all other systems are negative except as noted in the HPI and PMH.    Physical Exam Updated Vital Signs BP 113/83   Pulse (!) 50   Temp 98.9 F (37.2 C) (Oral)   Resp 16   Ht 5\' 11"  (1.803 m)   Wt 81 kg   SpO2 98%   BMI 24.91 kg/m   Physical Exam  Constitutional: He is oriented to person, place, and time. He appears well-developed and well-nourished. No distress.  HENT:  Head:  Normocephalic and atraumatic.  Mouth/Throat: Oropharynx is clear and moist. No oropharyngeal exudate.  Eyes: Pupils are equal, round, and reactive to light. Conjunctivae and EOM are normal.  Neck: Normal range of motion. Neck supple.  No meningismus.  Cardiovascular: Normal rate, regular rhythm, normal heart sounds and intact distal pulses.  No murmur heard. Pulmonary/Chest: Effort normal and breath sounds normal. No respiratory distress. He exhibits no tenderness.  Abdominal: Soft. There is no tenderness. There is no rebound and no guarding.  Abdomen nontender.  Sensation equal on each side of chest and abdomen.  No coloration difference in the skin.  No rash.  Musculoskeletal: Normal range of motion. He exhibits no edema or tenderness.  Neurological: He is alert and oriented to person, place, and time. No cranial nerve deficit. He exhibits normal muscle tone. Coordination normal.  No ataxia on finger to nose bilaterally. No pronator drift. 5/5 strength throughout. CN 2-12 intact.Equal grip strength. Sensation intact.   Skin: Skin is warm.  Psychiatric: He has a normal mood and affect. His behavior is normal.  Nursing note and vitals reviewed.    ED Treatments / Results  Labs (all labs ordered are listed, but only abnormal results are displayed) Labs Reviewed  COMPREHENSIVE METABOLIC PANEL - Abnormal; Notable for the following components:      Result Value   Glucose, Bld 105 (*)    Calcium 8.8 (*)    Total Protein 6.4 (*)    All other components within normal limits  URINALYSIS, ROUTINE W REFLEX MICROSCOPIC - Abnormal; Notable for the following components:   APPearance HAZY (*)    Specific Gravity, Urine 1.034 (*)    Protein, ur 30 (*)    Bacteria, UA RARE (*)    All other components within normal limits  CBC WITH DIFFERENTIAL/PLATELET  LIPASE, BLOOD  TROPONIN I  D-DIMER, QUANTITATIVE (NOT AT [facility])    EKG EKG Interpretation  Date/Time:  Friday October 27 2017 02:28:12  EDT Ventricular Rate:  47 PR Interval:    QRS Duration: 97 QT Interval:  436 QTC Calculation: 386 R Axis:   49 Text Interpretation:  Sinus bradycardia No significant change was found Confirmed by Glynn Octaveancour, Lexie Koehl [phone]([id number]) on 10/27/2017 2:33:29 AM   Radiology Dg Abdomen Acute W/chest  Result Date: 10/27/2017 CLINICAL DATA:  Left-sided abdominal pain EXAM: DG ABDOMEN ACUTE W/ 1V CHEST COMPARISON:  None. FINDINGS: Nonobstructed, nondistended bowel gas pattern with increased colonic stool retention within the colon from cecum through distal transverse. Gaseous mild distention of left colon to the level of the rectosigmoid is noted. No significant small bowel dilatation. No radiopaque calculi or other significant radiographic abnormality is seen. Heart size and mediastinal contours are within normal limits. Both lungs are clear. No acute osseous abnormality. Small bone island of the left femoral head is suspected. 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 IMPRESSION: 1. Increased stool burden from cecum to distal transverse colon. 2. No bowel obstruction or free air. 3. No radiopaque calculi. 4. No acute cardiopulmonary disease. 5. 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 Electronically Signed   By: Tollie Eth M.D.   On: 10/27/2017 03:06    Procedures Procedures (including critical care time)  Medications Ordered in ED Medications - No data to display   Initial Impression / Assessment and Plan / ED Course  I have reviewed the triage vital signs and the nursing notes.  Pertinent labs & imaging results that were available during my care of the patient were reviewed by me and considered in my medical decision making (see chart for details).    Episode of tingling and cool sensation to left chest and left abdomen which is since resolved.  No chest pain or shortness of breath.  No  abdominal pain.  Neurological exam nonfocal  Work-up is reassuring with normal labs and x-ray remarkable only for constipation. Normal electrolytes.  D-dimer is negative.  Unclear etiology of the patient's sensation he was feeling on his side of his abdomen but this has resolved.  Low suspicion for TIA or CVA.  Patient will be treated for constipation.  Follow-up with his PCP.  Return precautions discussed.  Final Clinical Impressions(s) / ED Diagnoses   Final diagnoses:  Constipation, unspecified constipation type  Paresthesias    ED Discharge Orders    None       Jayvan Mcshan, Jeannett Senior, MD 10/27/17 [phone]

## 2017-10-27 NOTE — Discharge Instructions (Addendum)
Your lab work is normal.  Take the medication for constipation as prescribed.  Establish care with a primary doctor.  Return to the ED if you develop new or worsening symptoms.

## 2018-06-08 ENCOUNTER — Encounter (HOSPITAL_COMMUNITY): Payer: Self-pay | Admitting: Emergency Medicine

## 2018-06-08 ENCOUNTER — Other Ambulatory Visit: Payer: Self-pay

## 2018-06-08 ENCOUNTER — Emergency Department (HOSPITAL_COMMUNITY)
Admission: EM | Admit: 2018-06-08 | Discharge: 2018-06-08 | Disposition: A | Discharge status: Home / Self Care | Payer: BLUE CROSS/BLUE SHIELD | Attending: Emergency Medicine | Admitting: Emergency Medicine

## 2018-06-08 DIAGNOSIS — Z79899 Other long term (current) drug therapy: Secondary | ICD-10-CM | POA: Insufficient documentation

## 2018-06-08 DIAGNOSIS — F17200 Nicotine dependence, unspecified, uncomplicated: Secondary | ICD-10-CM | POA: Insufficient documentation

## 2018-06-08 DIAGNOSIS — J029 Acute pharyngitis, unspecified: Secondary | ICD-10-CM | POA: Diagnosis present

## 2018-06-08 LAB — GROUP A STREP BY PCR: GROUP A STREP BY PCR: NOT DETECTED

## 2018-06-08 NOTE — ED Triage Notes (Signed)
Pt reports sore throat since yesterday. Used Chlroseptic spray x1  for discomfort.Reports occasional cough. Denies NVD, denies fever

## 2018-06-08 NOTE — ED Provider Notes (Signed)
[facility]-EMERGENCY DEPT Provider Note   CSN: [id number] Arrival date & time: 06/08/18  1028    History   Chief Complaint Chief Complaint  Patient presents with  . Sore Throat    HPI    Ian Barber is a 28 y.o. otherwise healthy male who presents to the ED with complaints of sore throat since yesterday.  His symptoms have been mild and constant.  He also reports a very mild minimal dry cough.  He has tried Chloraseptic spray with some relief.  Vaping seems to worsen his symptoms.  No known sick contacts, no recent travel.  He thinks he has environmental allergies because in the fall and spring he occasionally will get some allergy symptoms.  He believes his symptoms today are possibly from allergies.  He does not have a PCP, and he needs a work note stating that he was seen by a provider for his symptoms today.  This is why he came into the emergency room today.  He denies any fevers, chills, chest pain, shortness of breath, ear pain or drainage, rhinorrhea, body aches, or any other complaints at this time.  The history is provided by the patient and medical records. No language interpreter was used.  Sore Throat  Pertinent negatives include no chest pain and no shortness of breath.    No past medical history on file.  There are no active problems to display for this patient.   Past Surgical History:  Procedure Laterality Date  . APPENDECTOMY          Home Medications    Prior to Admission medications   Medication Sig Start Date End Date Taking? Authorizing Provider  chlorhexidine (PERIDEX) 0.12 % solution Use as directed 15 mLs in the mouth or throat 2 (two) times daily. Patient not taking: Reported on 09/23/2017 08/03/17   Elvina Sidle, MD  ciprofloxacin (CIPRO) 500 MG tablet Take 1 tablet (500 mg total) by mouth 2 (two) times daily. One po bid x 7 days Patient not taking: Reported on 10/26/2017 09/23/17   Dartha Lodge, PA-C   HYDROcodone-acetaminophen [facility]) 5-325 MG tablet Take 1 tablet by mouth every 6 (six) hours as needed. Patient not taking: Reported on 10/26/2017 09/23/17   Dartha Lodge, PA-C  ondansetron (ZOFRAN ODT) 4 MG disintegrating tablet  ODT q4 hours prn nausea/vomit Patient not taking: Reported on 10/27/2017 09/23/17   Dartha Lodge, PA-C  ondansetron (ZOFRAN) 4 MG tablet Take 1 tablet (4 mg total) by mouth every 6 (six) hours. Patient not taking: Reported on 09/23/2017 07/29/13   Rancour, Jeannett Senior, MD  polyethylene glycol [facility]) packet Take 17 g by mouth daily. 10/27/17   Rancour, Jeannett Senior, MD  promethazine (PHENERGAN) 25 MG tablet Take 1 tablet (25 mg total) by mouth every 6 (six) hours as needed for nausea. Patient not taking: Reported on 09/23/2017 03/04/12   Molpus, Jonny Ruiz, MD    Family History Family History  Problem Relation Age of Onset  . Heart disease Mother   . Schizophrenia Father   . Schizophrenia Sister   . Tuberculosis Brother     Social History Social History   Tobacco Use  . Smoking status: Current Every Day Smoker  . Smokeless tobacco: Never Used  Substance Use Topics  . Alcohol use: Yes    Comment: rarely  . Drug use: Yes    Types: Marijuana     Allergies   Amoxicillin and Penicillins   Review of Systems Review of Systems  Constitutional: Negative for chills and fever.  HENT: Positive for sore throat. Negative for ear discharge, ear pain and rhinorrhea.   Respiratory: Positive for cough (mild dry). Negative for shortness of breath.   Cardiovascular: Negative for chest pain.  Musculoskeletal: Negative for arthralgias and myalgias.  Allergic/Immunologic: Negative for immunocompromised state.     Physical Exam Updated Vital Signs BP 119/78 (BP Location: Right Arm)   Pulse 77   Temp 97.7 F (36.5 C) (Oral)   Resp 16   SpO2 100%   Physical Exam Vitals signs and nursing note reviewed.  Constitutional:      General: He is not in acute distress.     Appearance: Normal appearance. He is well-developed. He is not toxic-appearing.     Comments: Afebrile, nontoxic, NAD  HENT:     Head: Normocephalic and atraumatic.     Nose: Nose normal.     Mouth/Throat:     Mouth: Mucous membranes are moist.     Pharynx: Oropharynx is clear. Uvula midline. Posterior oropharyngeal erythema present. No pharyngeal swelling, oropharyngeal exudate or uvula swelling.     Tonsils: No tonsillar exudate or tonsillar abscesses. 0 on the right. 0 on the left.     Comments: Nose clear. Oropharynx mildly injected, without uvular swelling or deviation, no trismus or drooling, no tonsillar swelling or exudates. No PTA.    Eyes:     General:        Right eye: No discharge.        Left eye: No discharge.     Conjunctiva/sclera: Conjunctivae normal.  Neck:     Musculoskeletal: Normal range of motion and neck supple.  Cardiovascular:     Rate and Rhythm: Normal rate.     Pulses: Normal pulses.  Pulmonary:     Effort: Pulmonary effort is normal. No respiratory distress.     Breath sounds: Normal breath sounds and air entry. No decreased breath sounds, wheezing, rhonchi or rales.     Comments: CTAB in all lung fields, no w/r/r, no hypoxia or increased WOB, speaking in full sentences, SpO2 100% on RA  Abdominal:     General: There is no distension.  Musculoskeletal: Normal range of motion.  Lymphadenopathy:     Head:     Right side of head: Tonsillar adenopathy present.     Left side of head: Tonsillar adenopathy present.     Cervical: Cervical adenopathy present.     Comments: Shotty cervical and tonsillar LAD bilaterally which is mildly TTP  Skin:    General: Skin is warm and dry.     Findings: No rash.  Neurological:     Mental Status: He is alert and oriented to person, place, and time.     Sensory: Sensation is intact. No sensory deficit.     Motor: Motor function is intact.  Psychiatric:        Mood and Affect: Mood and affect normal.        Behavior:  Behavior normal.      ED Treatments / Results  Labs (all labs ordered are listed, but only abnormal results are displayed) Labs Reviewed  GROUP A STREP BY PCR    EKG None  Radiology No results found.  Procedures Procedures (including critical care time)  Medications Ordered in ED Medications - No data to display   Initial Impression / Assessment and Plan / ED Course  I have reviewed the triage vital signs and the nursing notes.  Pertinent labs & imaging results  that were available during my care of the patient were reviewed by me and considered in my medical decision making (see chart for details).        28 y.o. male here with sore throat since yesterday, also with a mild dry cough.  On exam, throat erythematous, no tonsillar swelling or exudates, no PTA.  Handling secretions well.  Shotty cervical and tonsillar lymphadenopathy bilaterally which is mildly TTP.  Clear lung exam.  Afebrile and nontoxic-appearing.  Patient believes he is having possible allergies, came in basically for a work note.  No other URI symptoms, no other lower respiratory symptoms.  No recent travel, afebrile, doubt COVID 19, doubt flu. Will get strep testing to ensure no strep, but doubt need for other labs or imaging. Will reassess shortly.   11:28 AM Strep neg. Likely allergic component vs viral etiology. Advised OTC remedies for symptomatic relief. Advised cessation of vaping. F/up with [facility] in 1-2wks for recheck and to establish medical care. I explained the diagnosis and have given explicit precautions to return to the ER including for any other new or worsening symptoms. The patient understands and accepts the medical plan as it's been dictated and I have answered their questions. Discharge instructions concerning home care and prescriptions have been given. The patient is STABLE and is discharged to home in good condition.    Final Clinical Impressions(s) / ED Diagnoses   Final diagnoses:   Sore throat    ED Discharge Orders    [address] [place], [place] 06/08/18 1128    Bethann Berkshire, MD 06/08/18 [phone]

## 2018-06-08 NOTE — Discharge Instructions (Signed)
Continue to stay well-hydrated. Gargle warm salt water and spit it out and use chloraseptic spray as needed for sore throat. Continue to alternate between Tylenol and Ibuprofen for pain or fever. Use Mucinex/Robitussin/etc for cough suppression/expectoration of mucus. Use over the counter flonase and the netipot to help with nasal congestion. May consider over-the-counter Benadryl or other antihistamine like Claritin/Zyrtec/etc to decrease secretions and for help with your symptoms. Follow up with the [facility] in 1-2 weeks for recheck of ongoing symptoms and to establish medical care. Return to emergency department for emergent changing or worsening of symptoms.

## 2019-09-13 ENCOUNTER — Ambulatory Visit: Payer: BLUE CROSS/BLUE SHIELD | Admitting: Podiatry

## 2019-09-26 ENCOUNTER — Encounter (INDEPENDENT_AMBULATORY_CARE_PROVIDER_SITE_OTHER): Payer: BLUE CROSS/BLUE SHIELD | Admitting: Podiatry

## 2019-09-26 NOTE — Progress Notes (Signed)
This encounter was created in error - please disregard.

## 2019-11-07 ENCOUNTER — Ambulatory Visit: Admitting: Psychology

## 2019-12-26 IMAGING — CT CT ABD-PELV W/ CM
2 of 4 series · 16 of 46 positions shown, 18 images · IV contrast (iopamidol)
Comparison: None.

CLINICAL DATA: Left lower quadrant pain off and on for a month or
2.

EXAM:
CT ABDOMEN AND PELVIS WITH CONTRAST
TECHNIQUE: Multidetector CT imaging of the abdomen and pelvis was performed
using the standard protocol following bolus administration of
intravenous contrast.
CONTRAST:  100mL EWFX1C-L77 IOPAMIDOL (EWFX1C-L77) INJECTION 61%

[Series 2: axial st · axial · 0.77mm/px · z∈[+1059,+1459]mm · 13 of 90 slices shown, 15 images]
[im 5/90  soft-tissue]
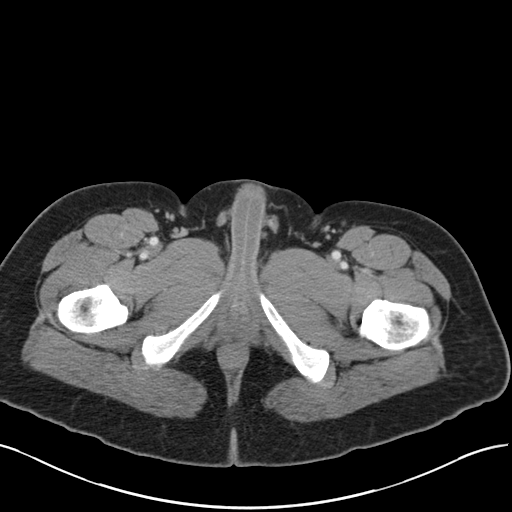
[im 5/90  bone]
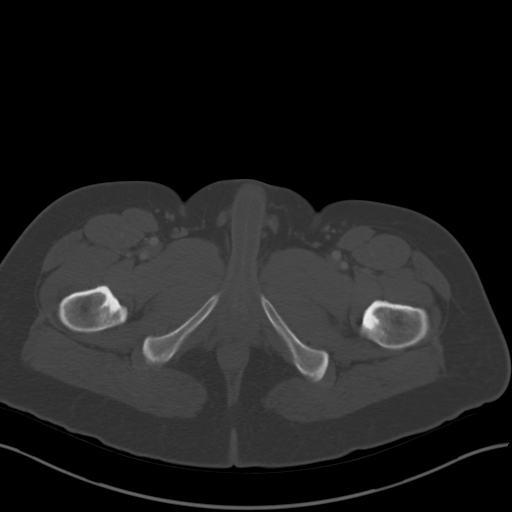
[im 14/90  soft-tissue]
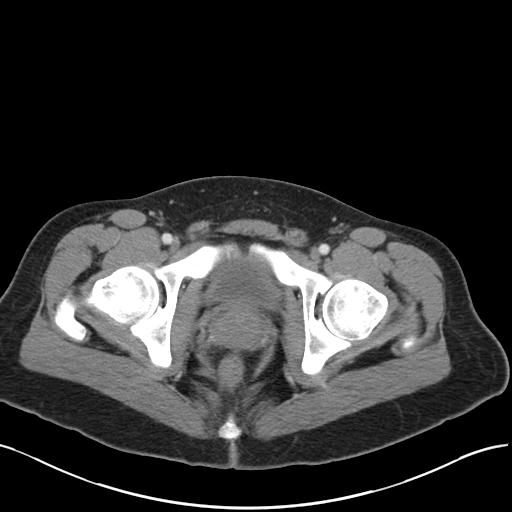
[im 18/90  soft-tissue]
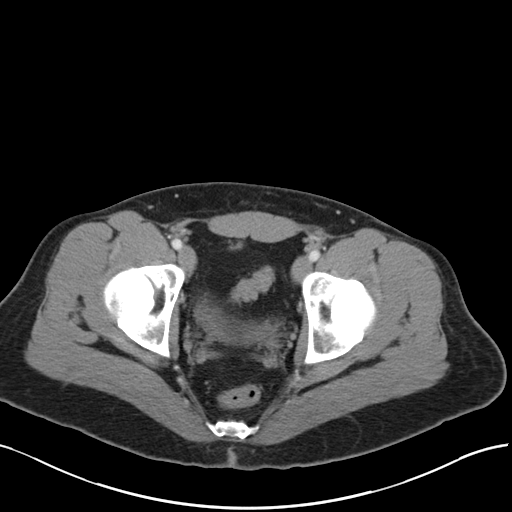
[im 27/90  soft-tissue]
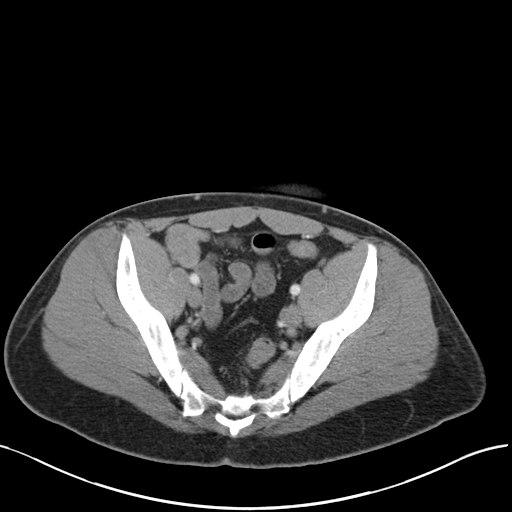
[im 32/90  soft-tissue]
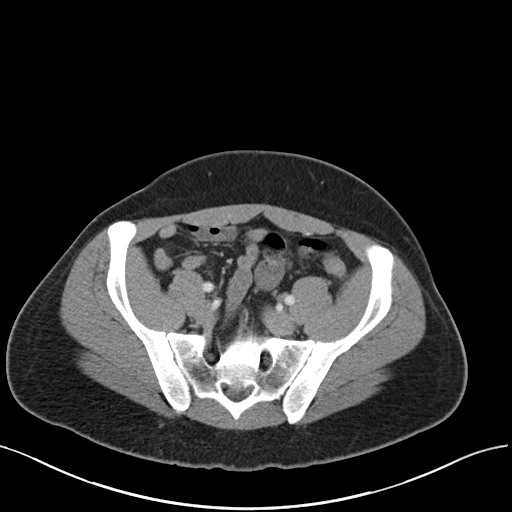
[im 41/90  soft-tissue]
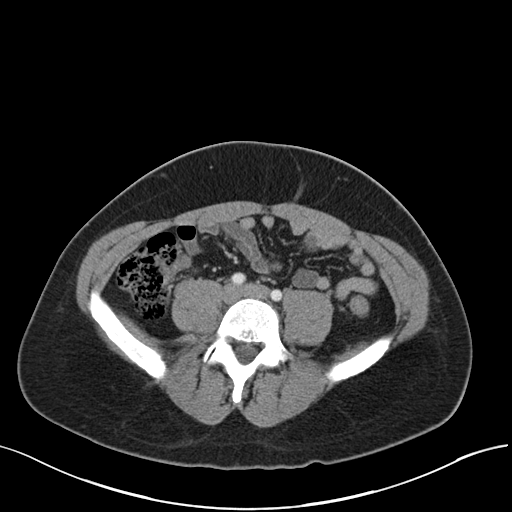
[im 45/90  soft-tissue]
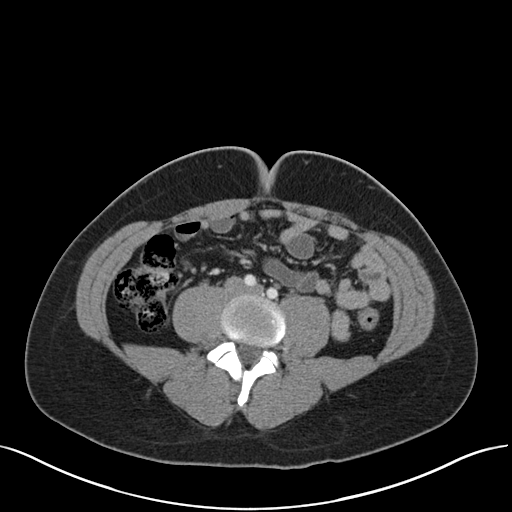
[im 49/90  soft-tissue]
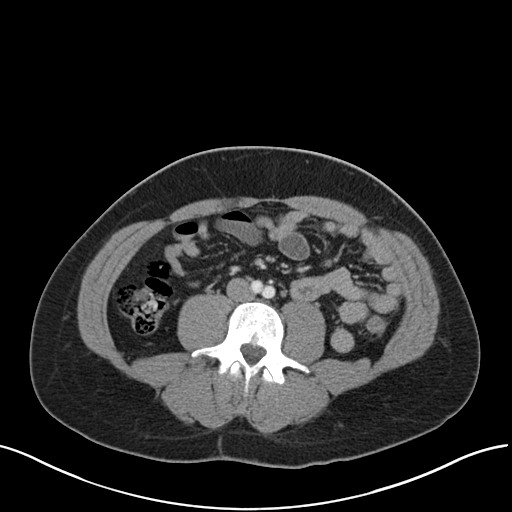
[im 58/90  soft-tissue]
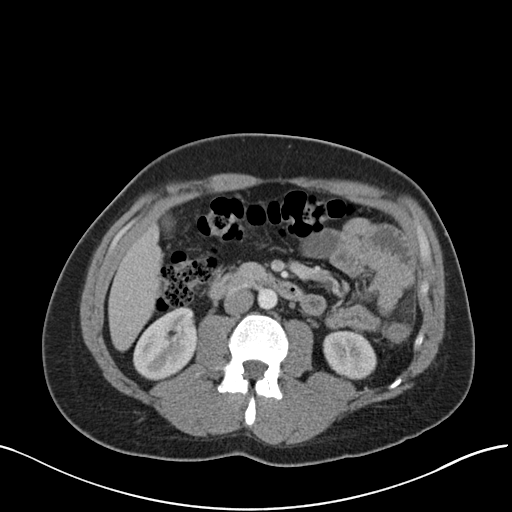
[im 58/90  bone]
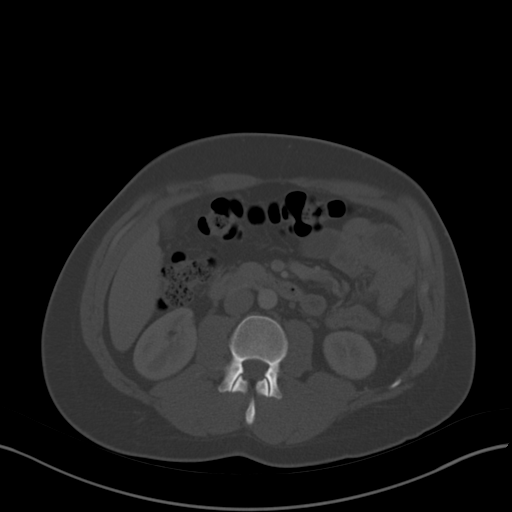
[im 63/90  soft-tissue]
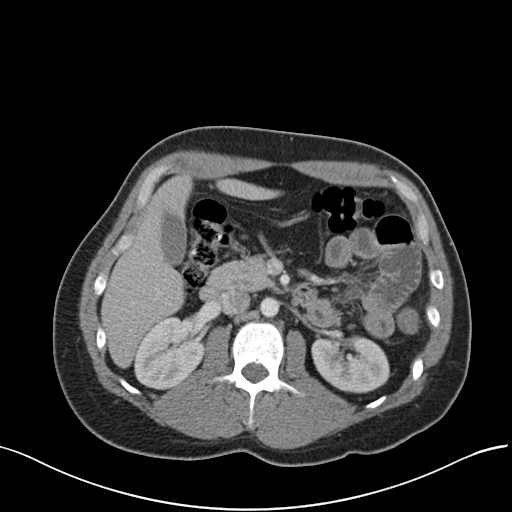
[im 72/90  soft-tissue]
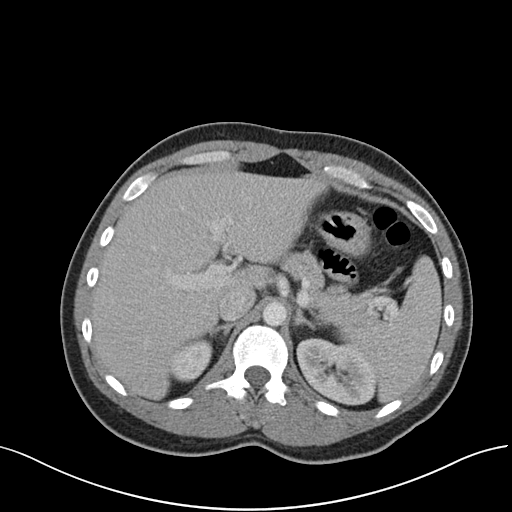
[im 76/90  soft-tissue]
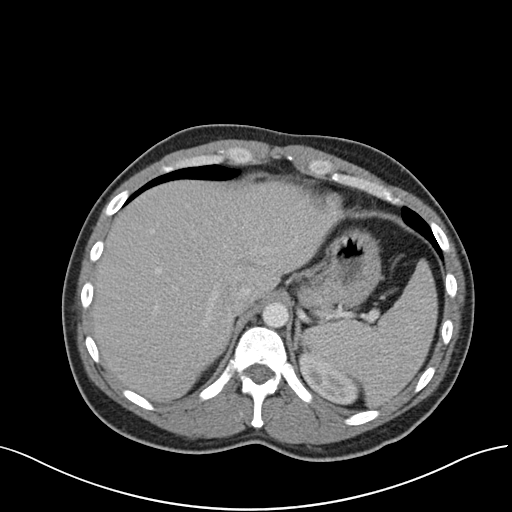
[im 85/90  soft-tissue]
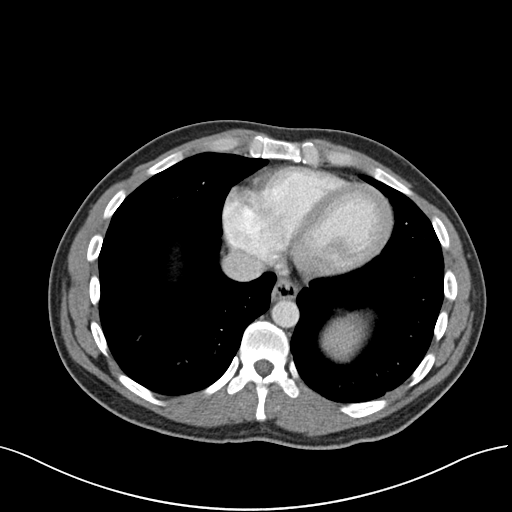

[Series 5: coronal st · coronal · 0.73mm/px · 3 of 93 slices shown]
[im 31/93  soft-tissue]
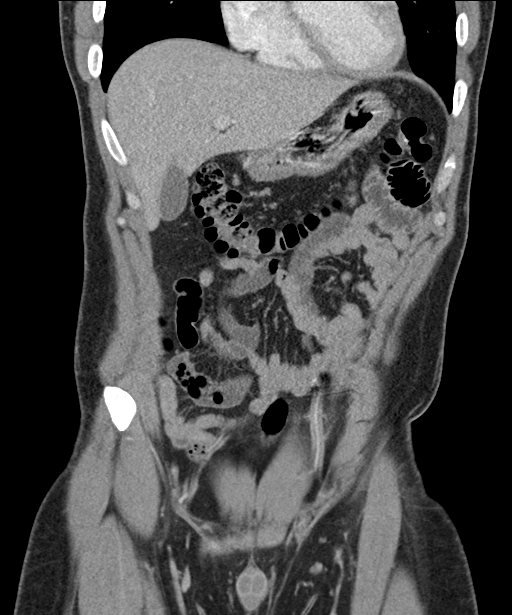
[im 41/93  soft-tissue]
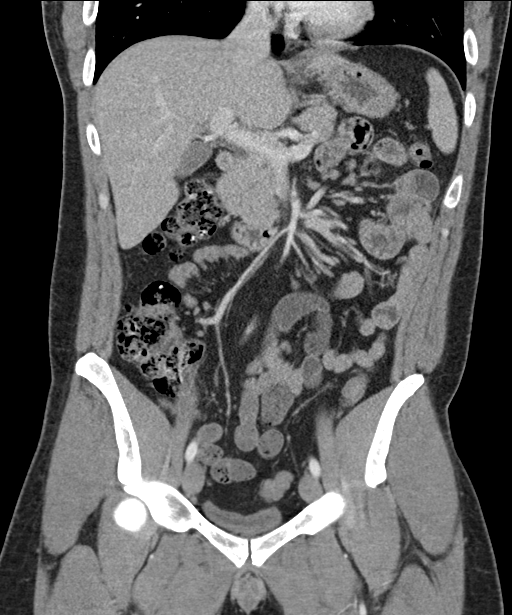
[im 52/93  soft-tissue]
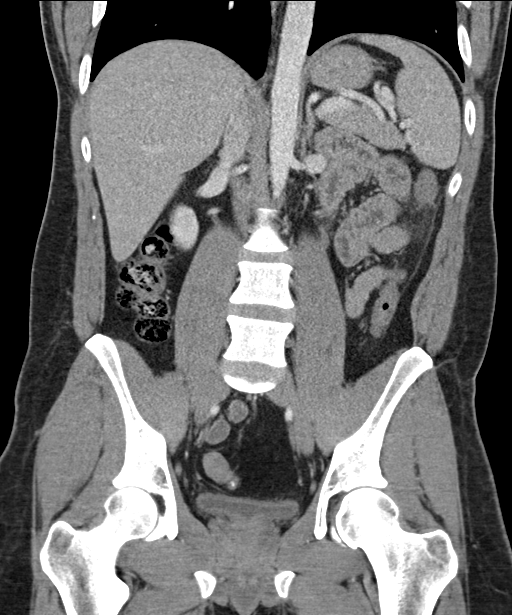

[16 of 46 positions shown; findings below may reference images not displayed]

FINDINGS: Lower chest: Lung bases are clear.

Hepatobiliary: No focal liver abnormality is seen. No gallstones,
gallbladder wall thickening, or biliary dilatation.

Pancreas: Unremarkable. No pancreatic ductal dilatation or
surrounding inflammatory changes.

Spleen: Normal in size without focal abnormality.

Adrenals/Urinary Tract: Adrenal glands are unremarkable. Kidneys are
normal, without renal calculi, focal lesion, or hydronephrosis.
Bladder is unremarkable.

Stomach/Bowel: Stomach, small bowel, and colon are not abnormally
distended. Scattered stool in the colon. No wall thickening or
inflammatory changes are identified. There a few scattered
diverticula in the colon. Infiltration in the pericolonic fat of the
upper descending colon just below the splenic flexure. This could
represent a focal area of acute diverticulitis or focal area of fat
necrosis. No abscess is identified.. Appendix is not identified.

Vascular/Lymphatic: No significant vascular findings are present. No
enlarged abdominal or pelvic lymph nodes.

Reproductive: Prostate gland is enlarged, measuring 4.7 cm diameter.

Other: No free air or free fluid in the abdomen. Abdominal wall
musculature appears intact.

Musculoskeletal: No acute or significant osseous findings.
IMPRESSION: 1. Scattered diverticula in the colon.
2. Infiltration in the pericolonic fat around the upper descending
colon just below the splenic fracture suggesting a focal area of
acute diverticulitis or focal fat necrosis. No abscess.
3. Prostate gland is enlarged.

## 2020-01-29 IMAGING — CR DG ABDOMEN ACUTE W/ 1V CHEST
3 series · 3 of 3 positions shown · non-contrast
Comparison: None.

CLINICAL DATA: Left-sided abdominal pain

EXAM:
DG ABDOMEN ACUTE W/ 1V CHEST

[w chest pa]
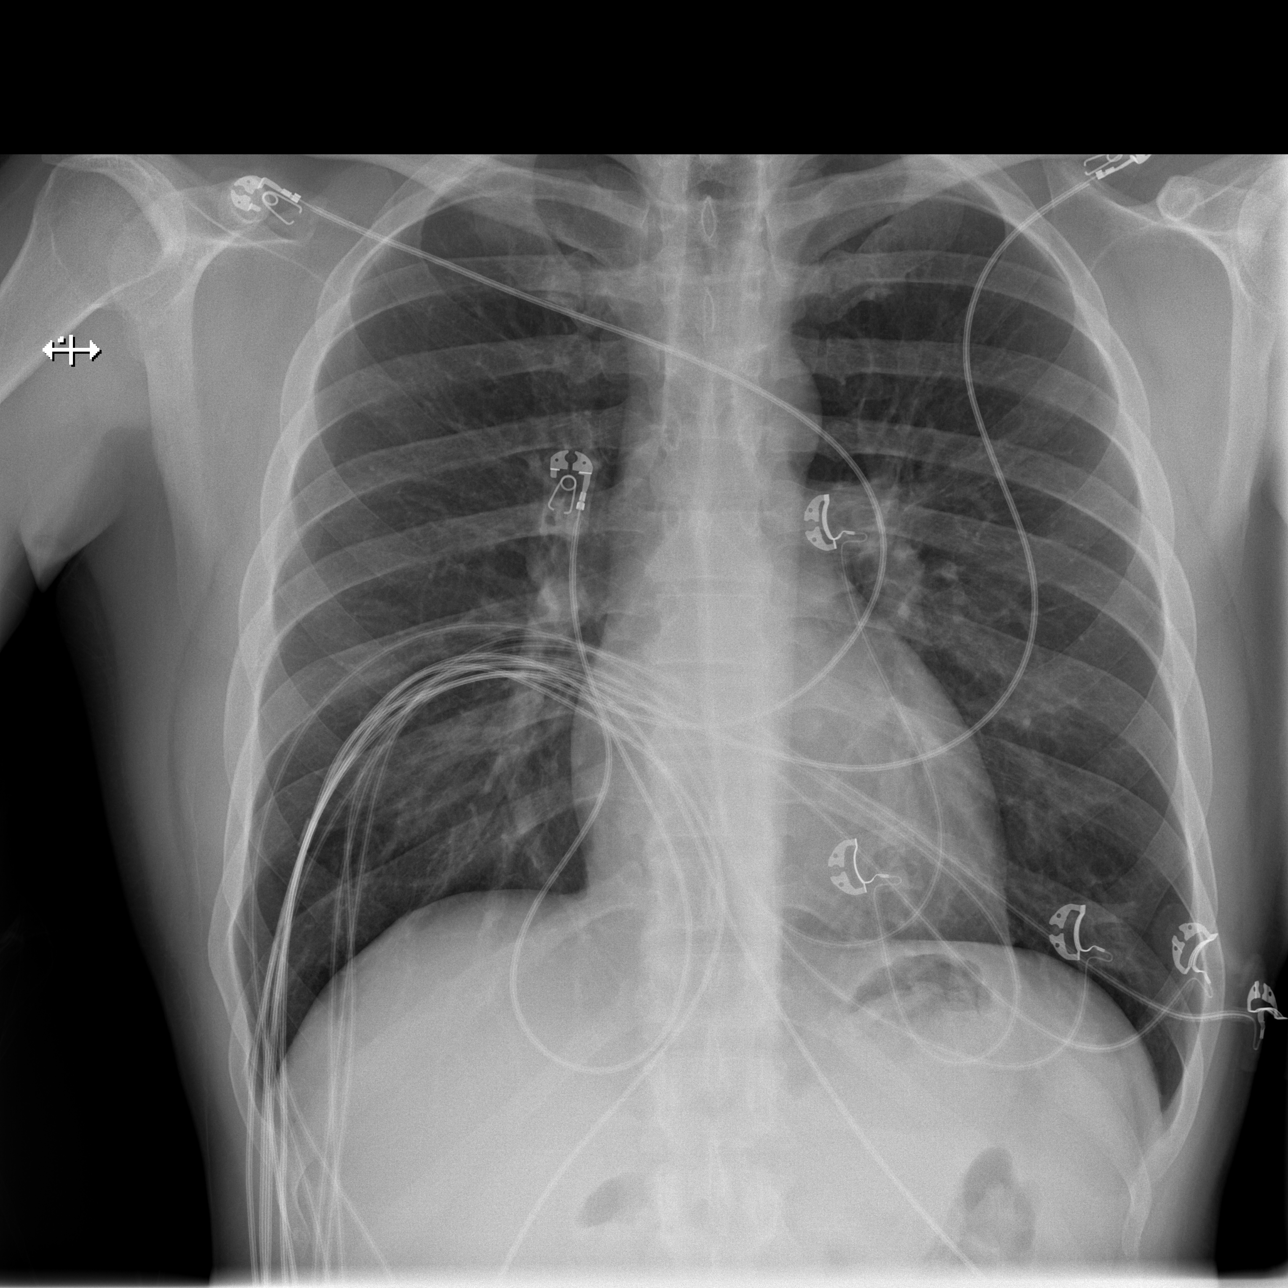

[w abdomen upright]
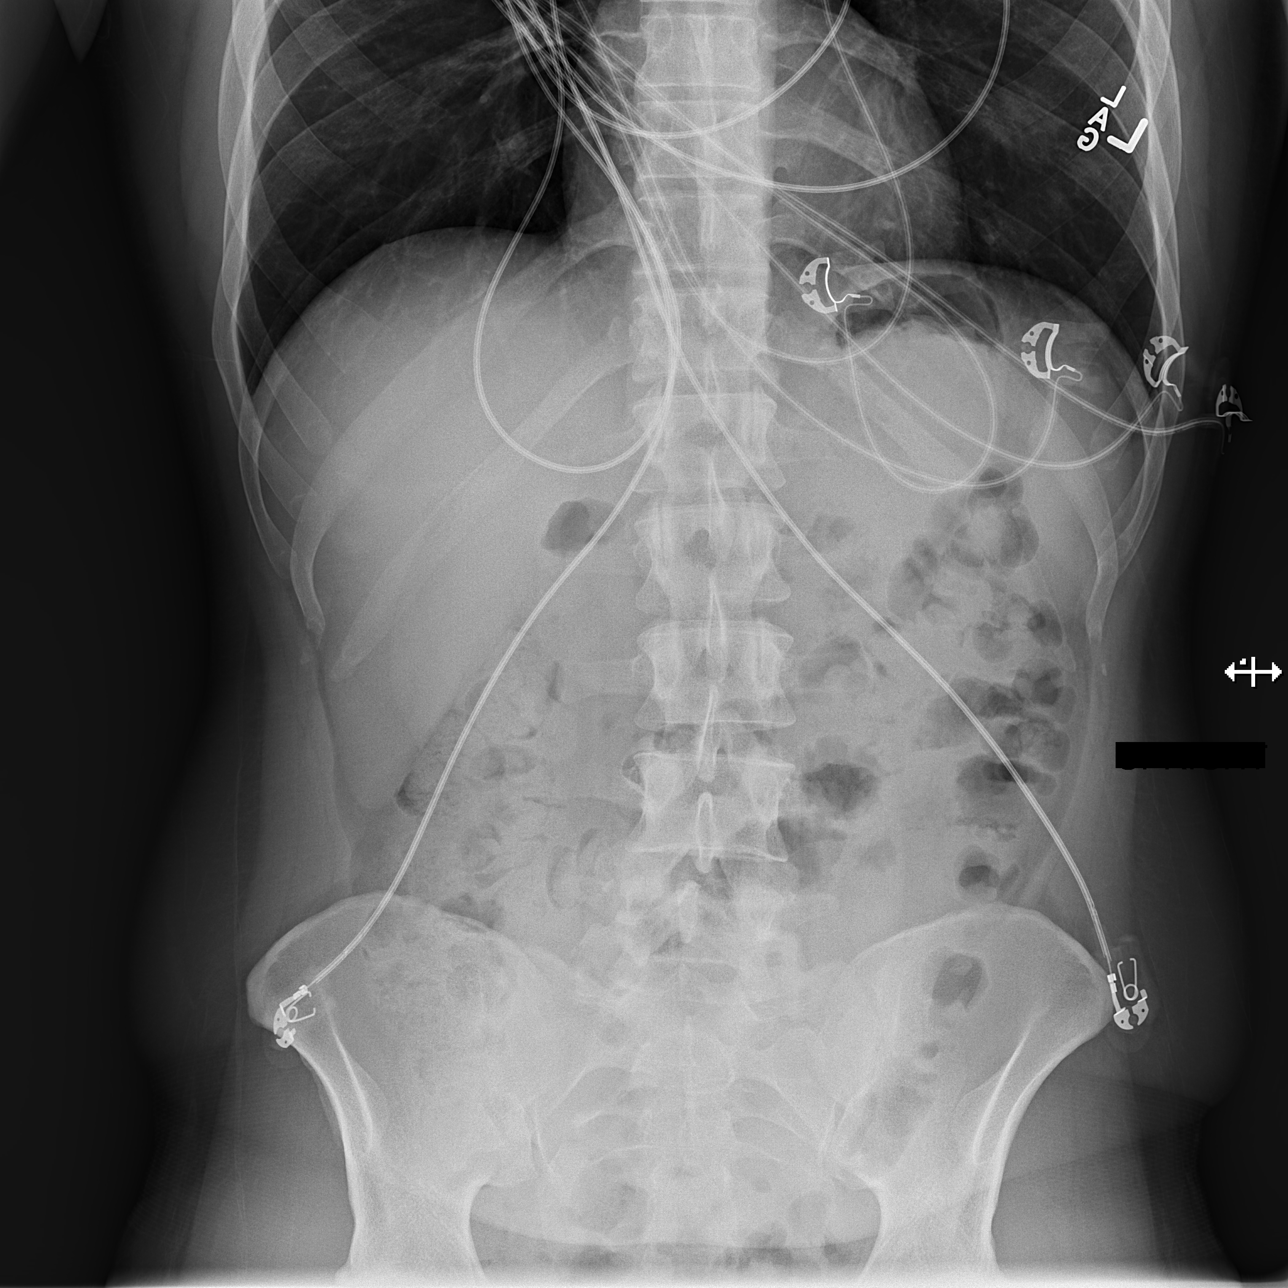

[t abdomen supine]
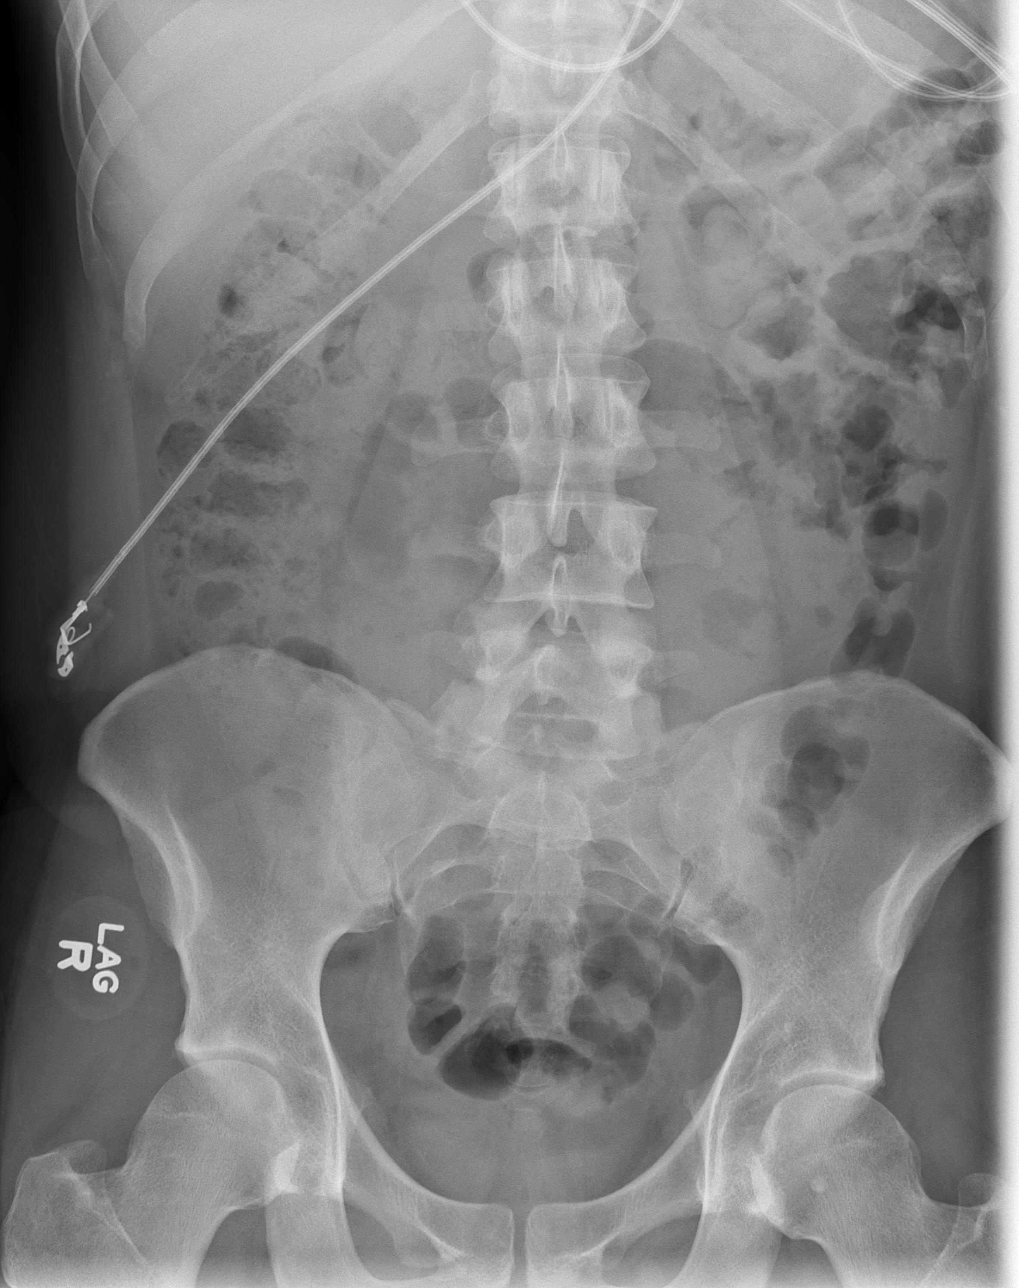

[3 of 3 positions shown; findings below may reference images not displayed]

FINDINGS: Nonobstructed, nondistended bowel gas pattern with increased colonic
stool retention within the colon from cecum through distal
transverse. Gaseous mild distention of left colon to the level of
the rectosigmoid is noted. No significant small bowel dilatation. No
radiopaque calculi or other significant radiographic abnormality is
seen. Heart size and mediastinal contours are within normal limits.
Both lungs are clear. No acute osseous abnormality. Small bone
island of the left femoral head is suspected. Slight
femoroacetabular impingement morphology of the CAM variety involving
the femoral head-neck junctures bilaterally.
IMPRESSION: 1. Increased stool burden from cecum to distal transverse colon.
2. No bowel obstruction or free air.
3. No radiopaque calculi.
4. No acute cardiopulmonary disease.
5. Femoroacetabular impingement morphology of the femoral head-neck
junctions.

## 2023-05-20 DEATH — deceased
